# Patient Record
Sex: Female | Born: 1955 | Race: White | Hispanic: No | Marital: Married
Health system: Midwestern US, Community
[De-identification: ages and names within clinical notes are randomized; demographics above are authoritative.]

## PROBLEM LIST (undated history)

## (undated) DIAGNOSIS — T8484XA Pain due to internal orthopedic prosthetic devices, implants and grafts, initial encounter: Secondary | ICD-10-CM

## (undated) DIAGNOSIS — S42452A Displaced fracture of lateral condyle of left humerus, initial encounter for closed fracture: Secondary | ICD-10-CM

## (undated) DIAGNOSIS — J939 Pneumothorax, unspecified: Secondary | ICD-10-CM

## (undated) DIAGNOSIS — J95811 Postprocedural pneumothorax: Secondary | ICD-10-CM

## (undated) DIAGNOSIS — K589 Irritable bowel syndrome without diarrhea: Secondary | ICD-10-CM

## (undated) DIAGNOSIS — R5383 Other fatigue: Secondary | ICD-10-CM

## (undated) HISTORY — PX: COLONOSCOPY: SHX174

## (undated) HISTORY — DX: Irritable bowel syndrome, unspecified: K58.9

## (undated) HISTORY — PX: FOOT SURGERY: SHX648

## (undated) HISTORY — DX: Other fatigue: R53.83

## (undated) HISTORY — PX: WISDOM TOOTH EXTRACTION: SHX21

## (undated) HISTORY — PX: TUBAL LIGATION: SHX77

---

## 2002-03-21 ENCOUNTER — Other Ambulatory Visit: Admission: RE | Admit: 2002-03-21 | Discharge: 2002-03-21 | Payer: Self-pay | Admitting: *Deleted

## 2002-10-17 ENCOUNTER — Encounter: Payer: Self-pay | Admitting: Internal Medicine

## 2002-10-17 ENCOUNTER — Encounter: Admission: RE | Admit: 2002-10-17 | Discharge: 2002-10-17 | Payer: Self-pay | Admitting: Internal Medicine

## 2003-08-21 ENCOUNTER — Other Ambulatory Visit: Admission: RE | Admit: 2003-08-21 | Discharge: 2003-08-21 | Payer: Self-pay | Admitting: Obstetrics and Gynecology

## 2005-07-19 ENCOUNTER — Ambulatory Visit: Payer: Self-pay | Admitting: Gastroenterology

## 2005-07-28 ENCOUNTER — Ambulatory Visit: Payer: Self-pay | Admitting: Gastroenterology

## 2005-08-11 ENCOUNTER — Ambulatory Visit (HOSPITAL_COMMUNITY): Admission: RE | Admit: 2005-08-11 | Discharge: 2005-08-11 | Payer: Self-pay | Admitting: *Deleted

## 2008-02-18 IMAGING — CT CT ABD-PELV W/O CM
4 of 7 series · 14 of 42 positions shown, 20 images · IV contrast (CONTRAST)
Comparison: none

[Series 2: wo · axial · 0.71mm/px · z∈[+1290,+1415]mm · 3 of 51 slices shown]
[im 13/51  soft-tissue]
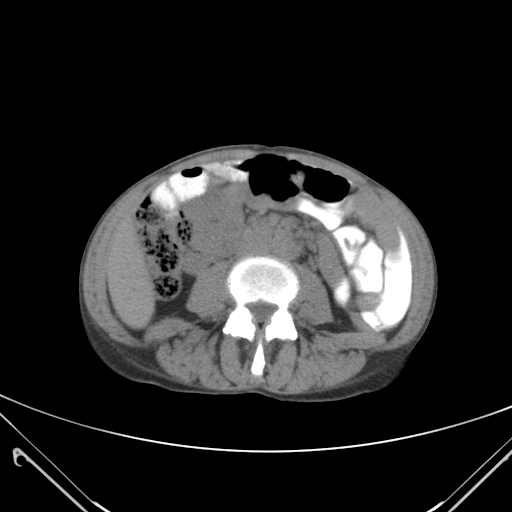
[im 26/51  soft-tissue]
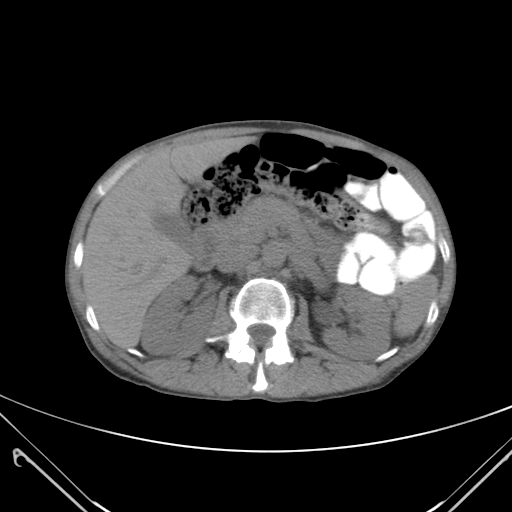
[im 38/51  soft-tissue]
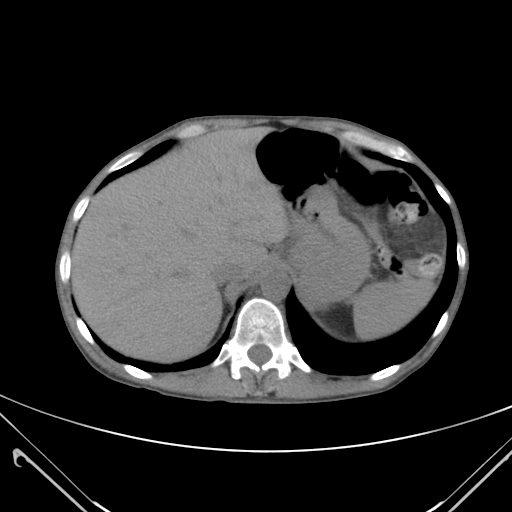

[Series 3: arterial · axial · arterial · 0.71mm/px · z∈[+1290,+1355]mm · 2 of 51 slices shown]
[im 13/51  soft-tissue]
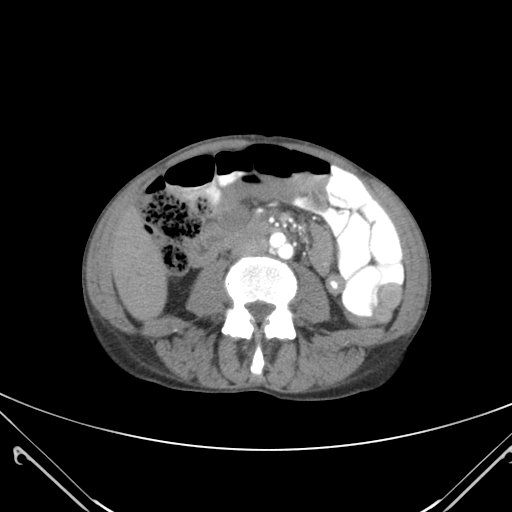
[im 26/51  soft-tissue]
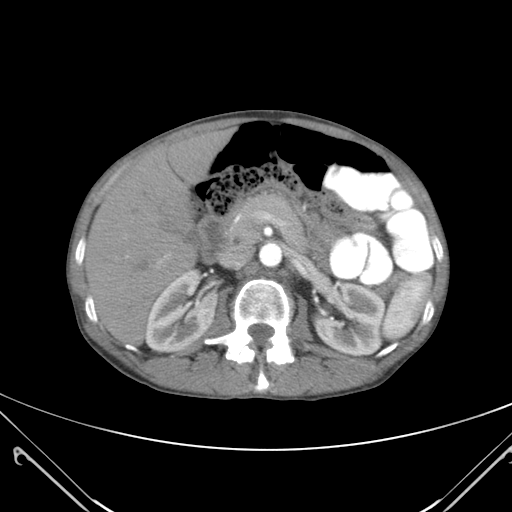

[Series 4: venous · axial · portal-venous · 0.71mm/px · z∈[+1090,+1415]mm · 6 of 91 slices shown, 11 images]
[im 13/91  soft-tissue]
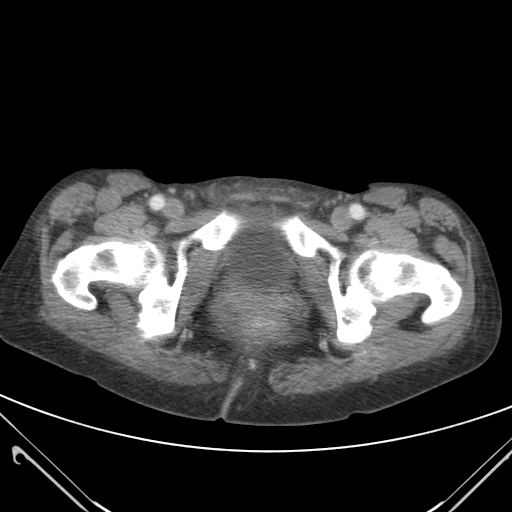
[im 13/91  bone]
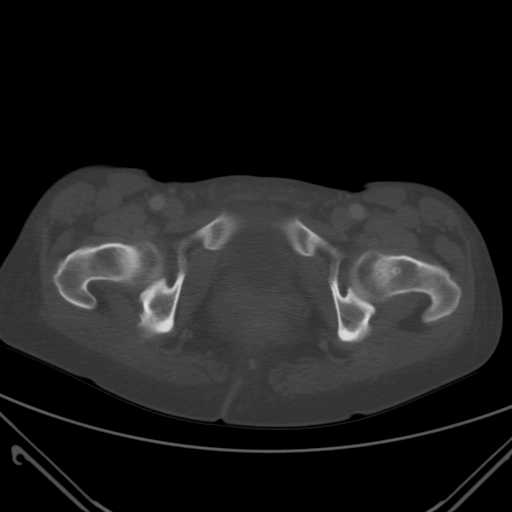
[im 26/91  soft-tissue]
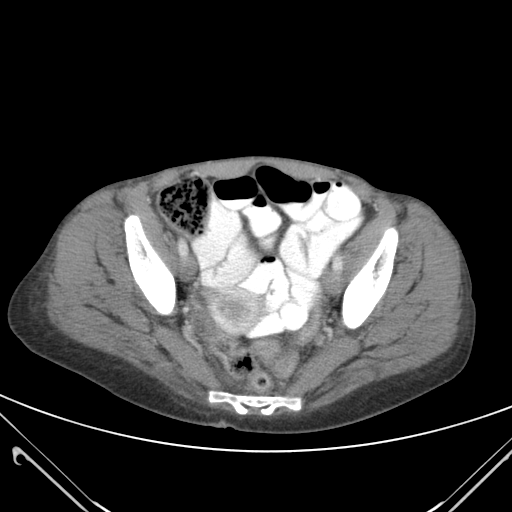
[im 39/91  soft-tissue]
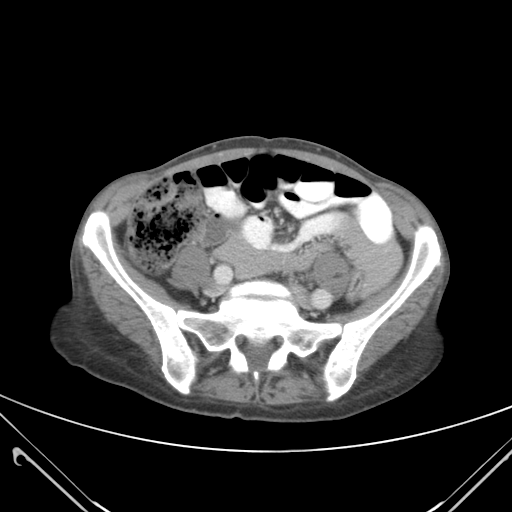
[im 39/91  lung]
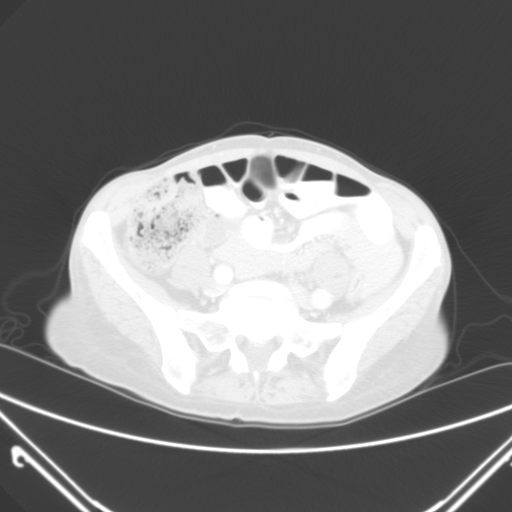
[im 52/91  soft-tissue]
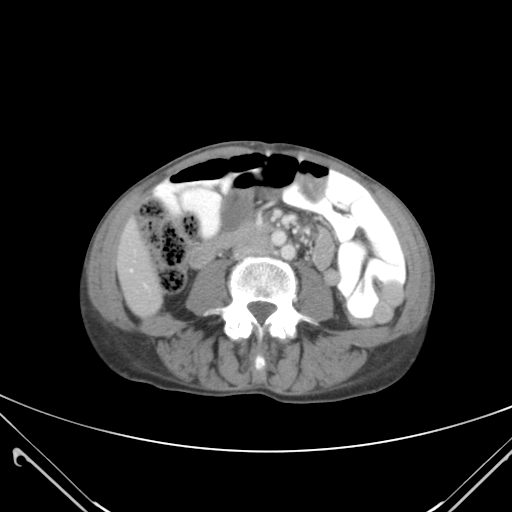
[im 52/91  lung]
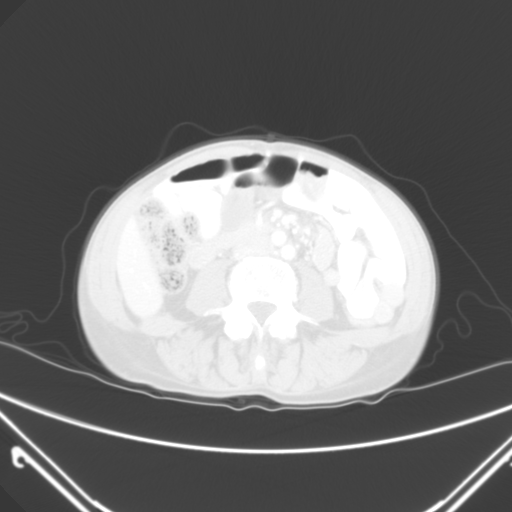
[im 65/91  soft-tissue]
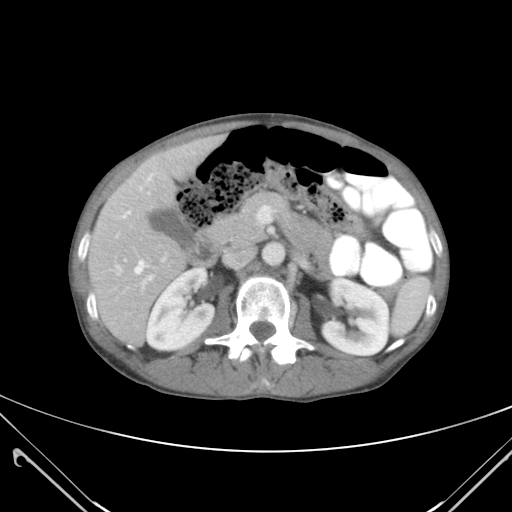
[im 65/91  lung]
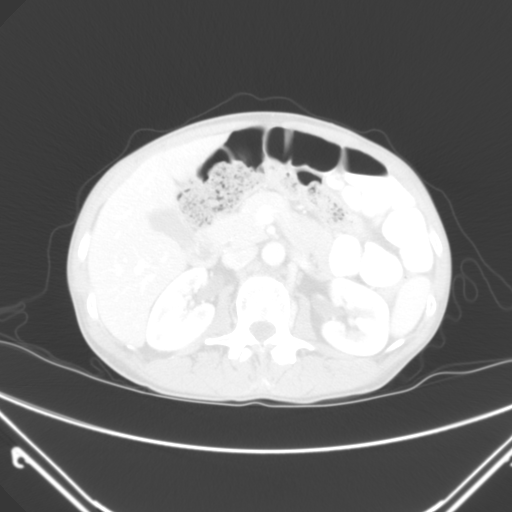
[im 78/91  soft-tissue]
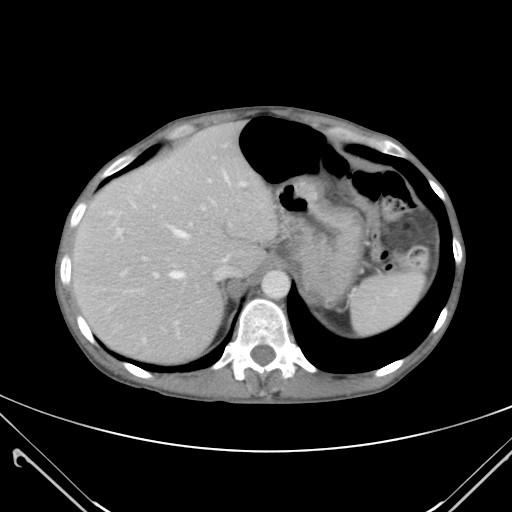
[im 78/91  lung]
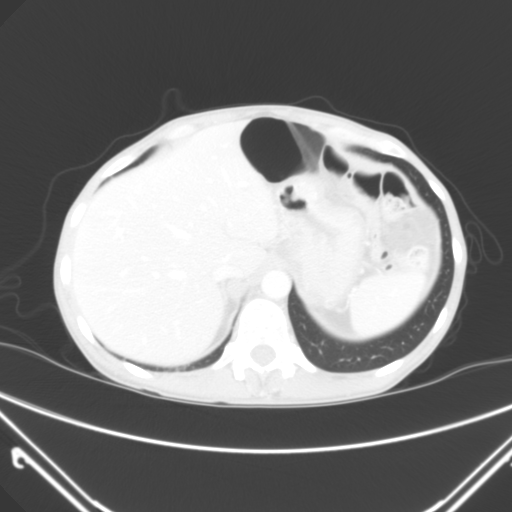

[coronal · coronal · 0.88mm/px · 3 of 72 slices shown, 4 images]
[im 24/72  soft-tissue]
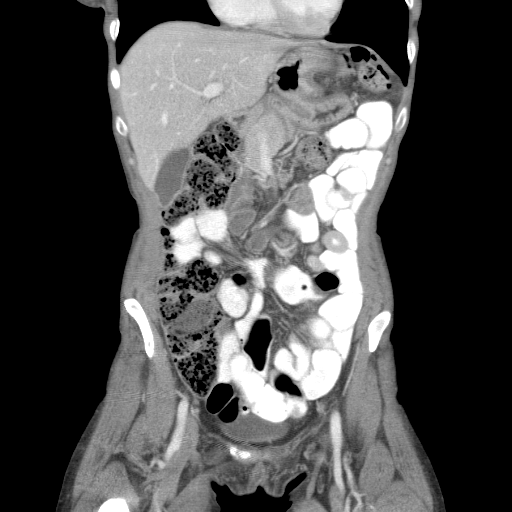
[im 32/72  soft-tissue]
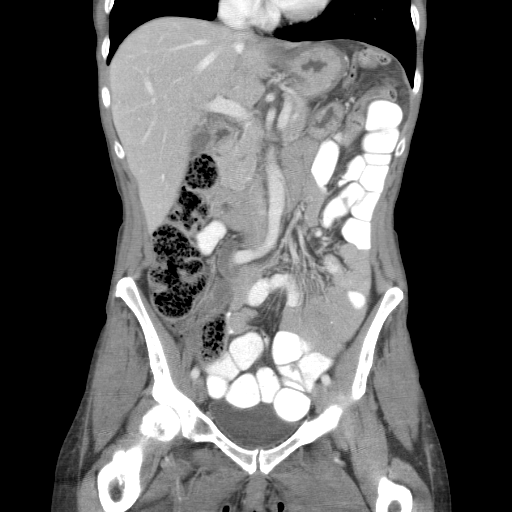
[im 32/72  bone]
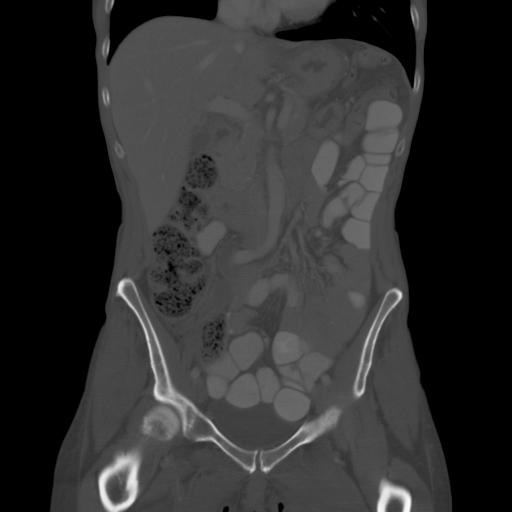
[im 40/72  soft-tissue]
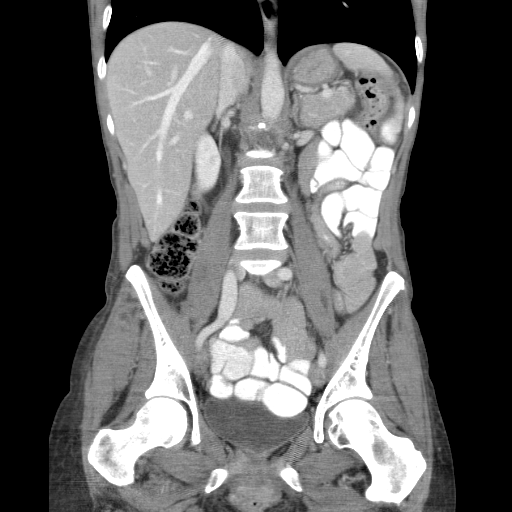

[14 of 42 positions shown; findings below may reference images not displayed]

Canned report from images found in remote index.

Refer to host system for actual result text.

## 2008-03-17 IMAGING — US US TRANSVAGINAL NON-OB
1 series · 14 of 25 positions shown · non-contrast
Comparison: none

CLINICAL DATA: Possible ovarian cyst.  Left lower quadrant pain.
 TRANSABDOMINAL AND TRANSVAGINAL PELVIC ULTRASOUND:
TECHNIQUE: Both transabdominal and transvaginal ultrasound examinations of the pelvis were performed including evaluation of the uterus, ovaries, adnexal regions, and pelvic cul-de-sac.

[Series 1: us transvaginal non-ob · 0.27mm/px · 14 of 74 slices shown]
[im 1/74]
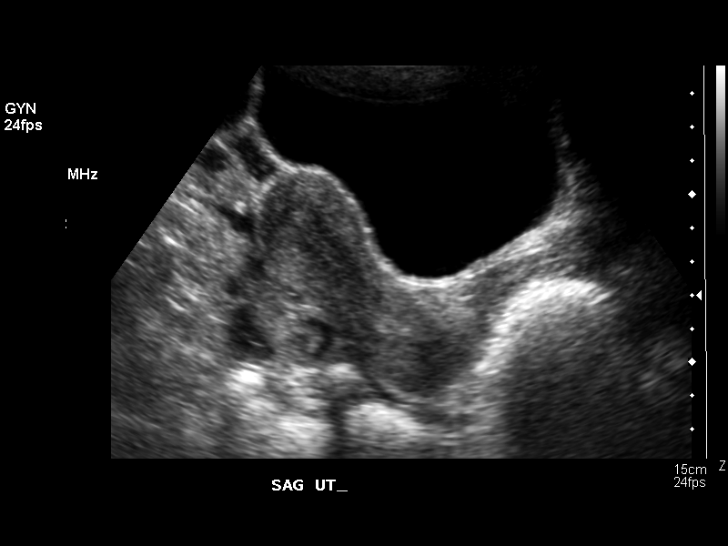
[im 7/74]
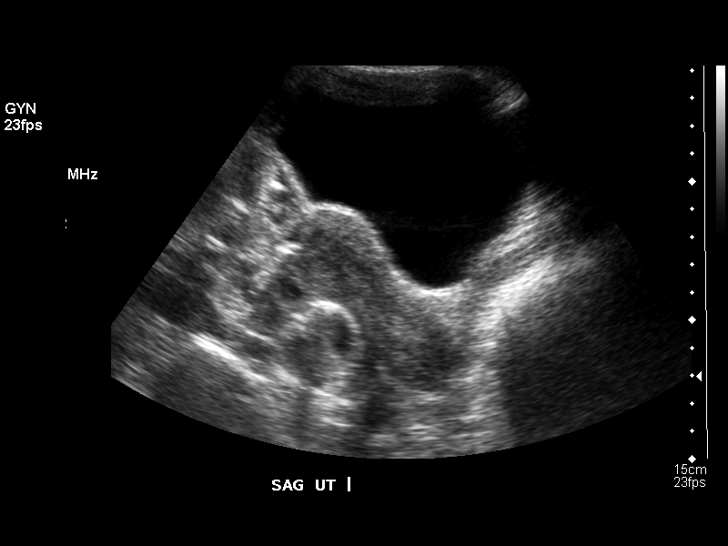
[im 13/74]
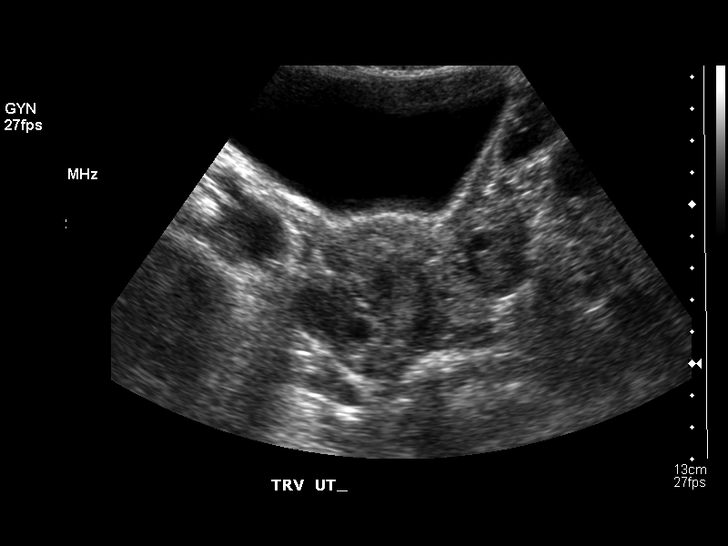
[im 19/74]
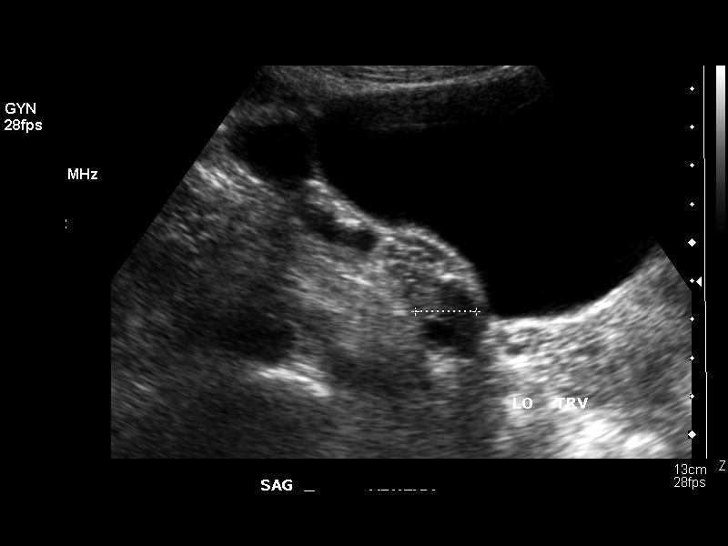
[im 25/74]
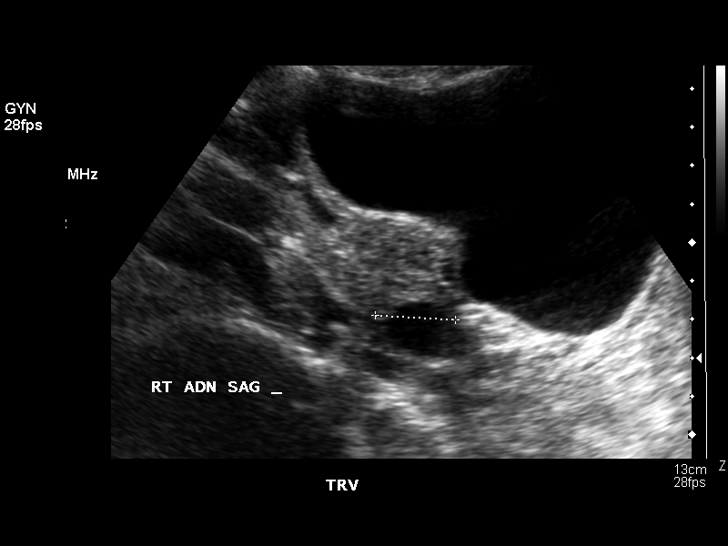
[im 28/74]
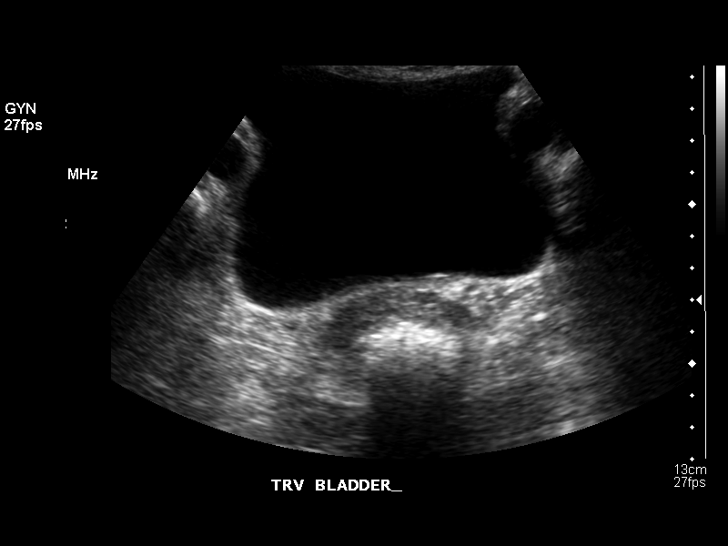
[im 34/74]
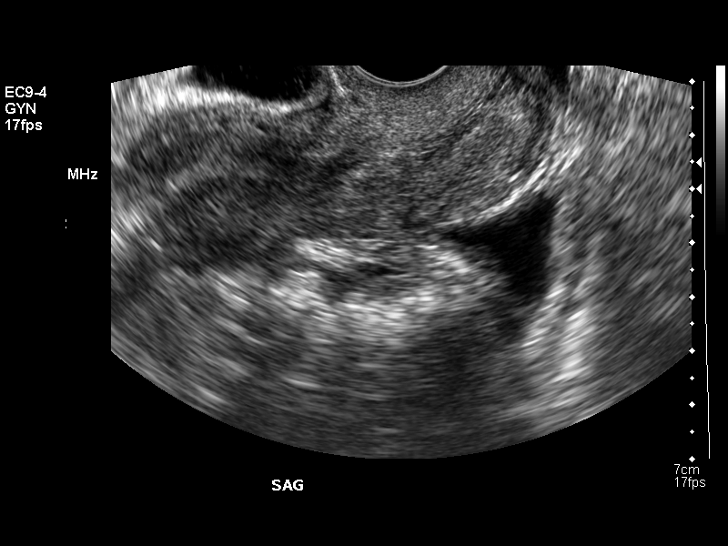
[im 40/74]
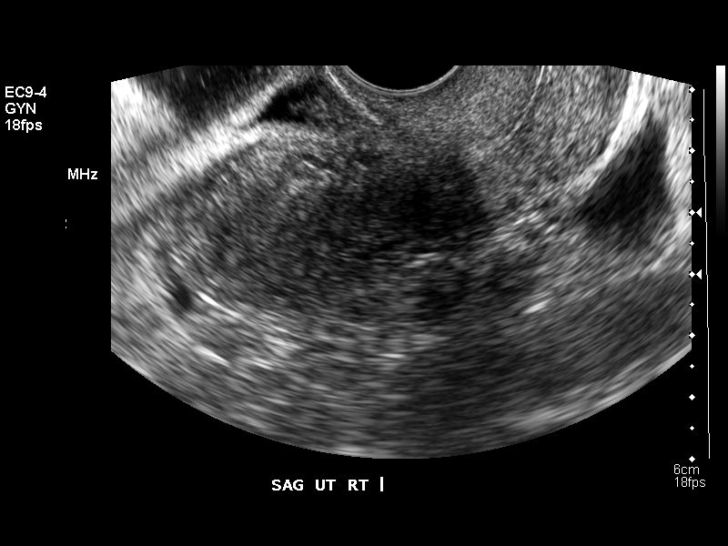
[im 46/74]
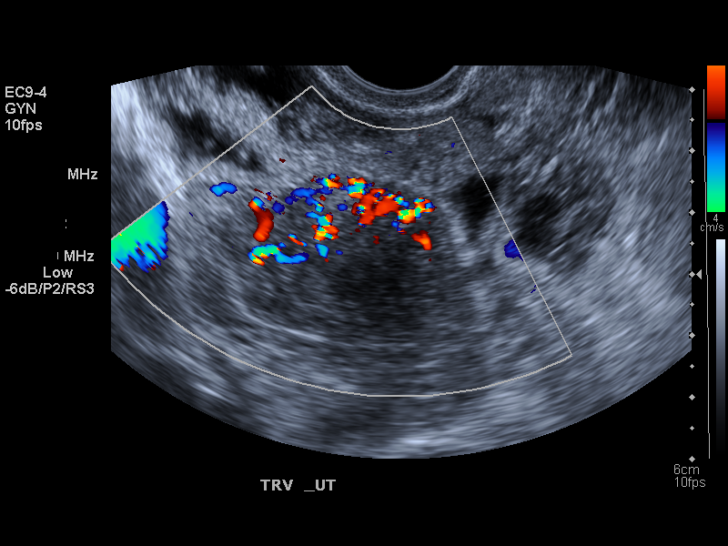
[im 49/74]
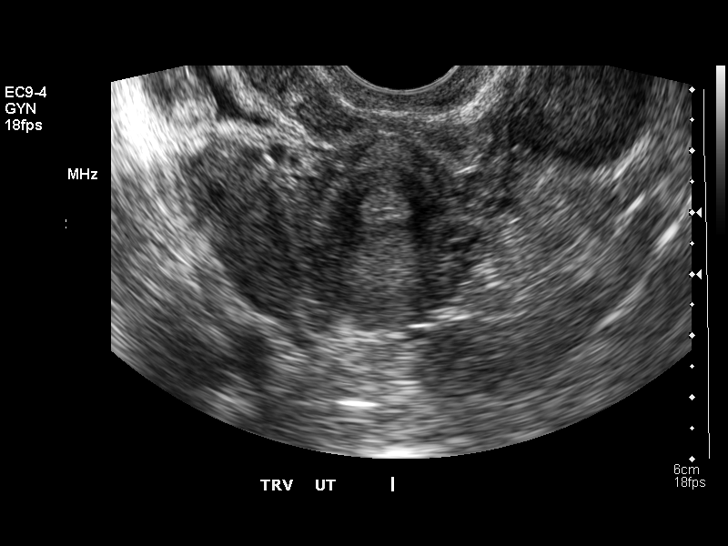
[im 55/74]
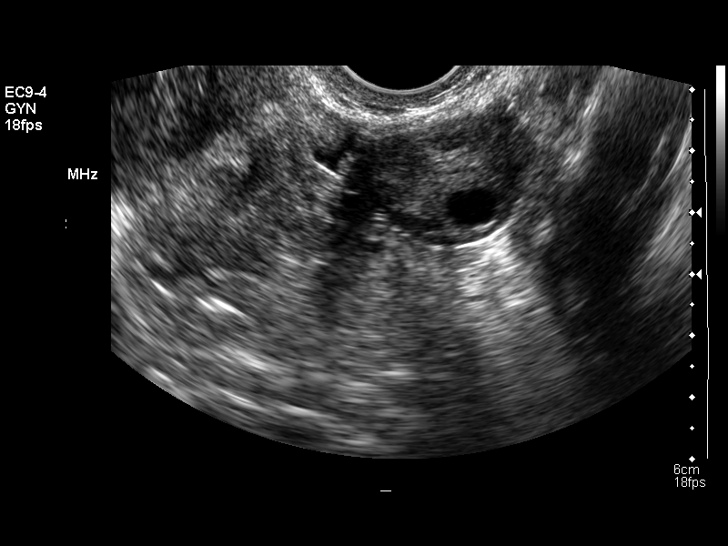
[im 61/74]
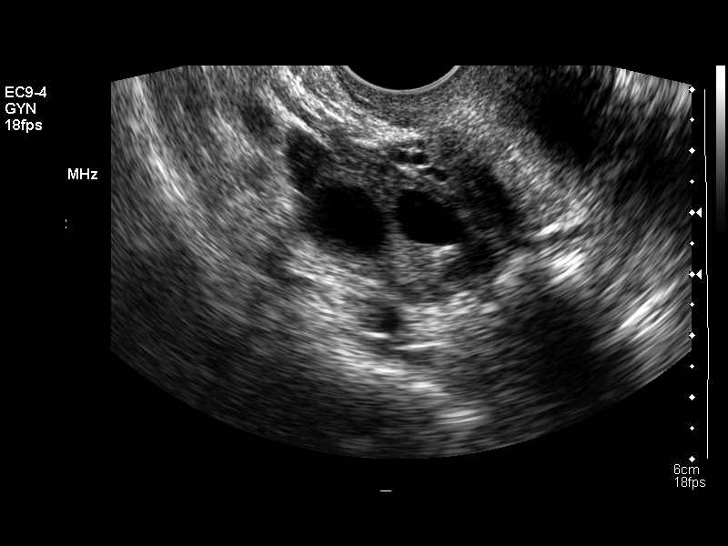
[im 67/74]
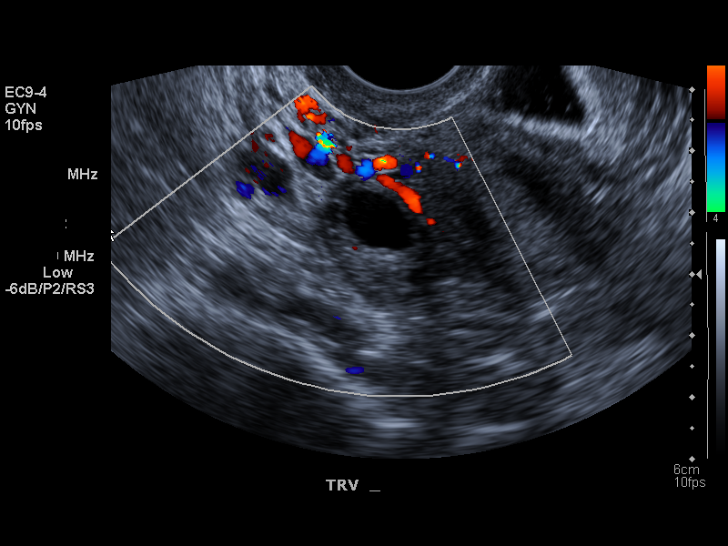
[im 74/74]
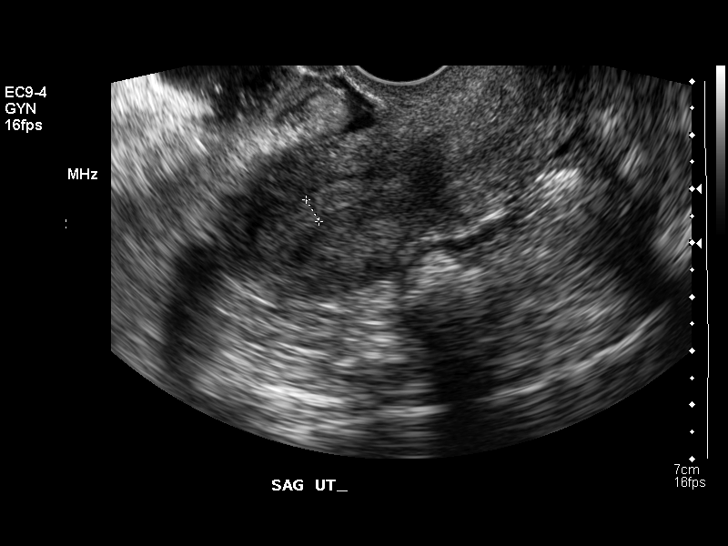

[14 of 25 positions shown; findings below may reference images not displayed]

FINDINGS: The uterus measures 8.1 x 3.1 x 3.2 cm.  The endometrial stripe is 5 - 6 mm in thickness.  Myometrial echotexture is homogeneous.  
 The right ovary measures 3.3 x 1.8 x 1.9 cm.  The left ovary measures 2.5 x 2.0 x 1.9 cm.   A small amount of simple appearing free fluid is noted in the cul-de-sac.
IMPRESSION: 1.  Normal sonographic appearance of the uterus and ovaries.
 2.  Small amount of free fluid in the cul-de-sac.

## 2015-06-30 ENCOUNTER — Encounter: Payer: Self-pay | Admitting: Internal Medicine

## 2015-07-26 ENCOUNTER — Encounter: Payer: Self-pay | Admitting: Gastroenterology

## 2015-10-05 ENCOUNTER — Encounter: Payer: Self-pay | Admitting: Gastroenterology

## 2015-10-27 ENCOUNTER — Ambulatory Visit (AMBULATORY_SURGERY_CENTER): Payer: BLUE CROSS/BLUE SHIELD

## 2015-10-27 VITALS — Ht 68.0 in | Wt 125.8 lb

## 2015-10-27 DIAGNOSIS — Z1211 Encounter for screening for malignant neoplasm of colon: Secondary | ICD-10-CM

## 2015-10-27 MED ORDER — SUPREP BOWEL PREP KIT 17.5-3.13-1.6 GM/177ML PO SOLN: 1.0000 | Freq: Once | ORAL | Status: DC

## 2015-10-27 NOTE — Progress Notes (Signed)
No allergies to eggs or soy No past problems with anesthesia No diet meds No home oxygen  Has email and internet registered for emmi 

## 2015-11-03 DIAGNOSIS — L821 Other seborrheic keratosis: Secondary | ICD-10-CM | POA: Diagnosis not present

## 2015-11-03 DIAGNOSIS — D225 Melanocytic nevi of trunk: Secondary | ICD-10-CM | POA: Diagnosis not present

## 2015-11-03 DIAGNOSIS — D2271 Melanocytic nevi of right lower limb, including hip: Secondary | ICD-10-CM | POA: Diagnosis not present

## 2015-11-03 DIAGNOSIS — D1801 Hemangioma of skin and subcutaneous tissue: Secondary | ICD-10-CM | POA: Diagnosis not present

## 2015-11-03 DIAGNOSIS — L308 Other specified dermatitis: Secondary | ICD-10-CM | POA: Diagnosis not present

## 2015-11-18 ENCOUNTER — Ambulatory Visit (AMBULATORY_SURGERY_CENTER): Payer: BLUE CROSS/BLUE SHIELD | Admitting: Gastroenterology

## 2015-11-18 ENCOUNTER — Encounter: Payer: Self-pay | Admitting: Gastroenterology

## 2015-11-18 VITALS — BP 100/64 | HR 55 | Temp 98.9°F | Resp 15 | Ht 68.0 in | Wt 125.0 lb

## 2015-11-18 DIAGNOSIS — Z1211 Encounter for screening for malignant neoplasm of colon: Secondary | ICD-10-CM | POA: Diagnosis not present

## 2015-11-18 MED ORDER — SODIUM CHLORIDE 0.9 % IV SOLN: 500.0000 mL | INTRAVENOUS | Status: DC

## 2015-11-18 NOTE — Progress Notes (Signed)
Report to PACU, RN, vss, BBS= Clear.  

## 2015-11-18 NOTE — Patient Instructions (Signed)
YOU HAD AN ENDOSCOPIC PROCEDURE TODAY AT THE [facility]:   Refer to the procedure report that was given to you for any specific questions about what was found during the examination.  If the procedure report does not answer your questions, please call your gastroenterologist to clarify.  If you requested that your care partner not be given the details of your procedure findings, then the procedure report has been included in a sealed envelope for you to review at your convenience later.  YOU SHOULD EXPECT: Some feelings of bloating in the abdomen. Passage of more gas than usual.  Walking can help get rid of the air that was put into your GI tract during the procedure and reduce the bloating. If you had a lower endoscopy (such as a colonoscopy or flexible sigmoidoscopy) you may notice spotting of blood in your stool or on the toilet paper. If you underwent a bowel prep for your procedure, you may not have a normal bowel movement for a few days.  Please Note:  You might notice some irritation and congestion in your nose or some drainage.  This is from the oxygen used during your procedure.  There is no need for concern and it should clear up in a day or so.  SYMPTOMS TO REPORT IMMEDIATELY:   Following lower endoscopy (colonoscopy or flexible sigmoidoscopy):  Excessive amounts of blood in the stool  Significant tenderness or worsening of abdominal pains  Swelling of the abdomen that is new, acute  Fever of 100F or higher  For urgent or emergent issues, a gastroenterologist can be reached at any hour by calling [phone].  DIET: Your first meal following the procedure should be a small meal and then it is ok to progress to your normal diet. Heavy or fried foods are harder to digest and may make you feel nauseous or bloated.  Likewise, meals heavy in dairy and vegetables can increase bloating.  Drink plenty of fluids but you should avoid alcoholic beverages for 24 hours.  ACTIVITY:   You should plan to take it easy for the rest of today and you should NOT DRIVE or use heavy machinery until tomorrow (because of the sedation medicines used during the test).    FOLLOW UP: Our staff will call the number listed on your records the next business day following your procedure to check on you and address any questions or concerns that you may have regarding the information given to you following your procedure. If we do not reach you, we will leave a message.  However, if you are feeling well and you are not experiencing any problems, there is no need to return our call.  We will assume that you have returned to your regular daily activities without incident.  SIGNATURES/CONFIDENTIALITY: You and/or your care partner have signed paperwork which will be entered into your electronic medical record.  These signatures attest to the fact that that the information above on your After Visit Summary has been reviewed and is understood.  Full responsibility of the confidentiality of this discharge information lies with you and/or your care-partner.  Please continue your normal medications  Please read over handouts about hemorrhoids and high fiber diet  Next colonoscopy- 10 years

## 2015-11-18 NOTE — Op Note (Signed)
[facility] Patient Name: Melissa FriezeBeth Duarte Procedure Date: 11/18/2015 7:46 AM MRN: [id number] Endoscopist: Viviann SpareSteven P. Adela LankArmbruster , MD Age: 60 Referring MD:  Date of Birth: May 09, 1956 Gender: Female Account #: [id number] Procedure:                Colonoscopy Indications:              Screening for malignant neoplasm in the colon Medicines:                Monitored Anesthesia Care Procedure:                Pre-Anesthesia Assessment:                           - Prior to the procedure, a History and Physical                            was performed, and patient medications and                            allergies were reviewed. The patient's tolerance of                            previous anesthesia was also reviewed. The risks                            and benefits of the procedure and the sedation                            options and risks were discussed with the patient.                            All questions were answered, and informed consent                            was obtained. Prior Anticoagulants: The patient has                            taken no previous anticoagulant or antiplatelet                            agents. ASA Grade Assessment: II - A patient with                            mild systemic disease. After reviewing the risks                            and benefits, the patient was deemed in                            satisfactory condition to undergo the procedure.                           After obtaining informed consent, the colonoscope  was passed under direct vision. Throughout the                            procedure, the patient's blood pressure, pulse, and                            oxygen saturations were monitored continuously. The                            Model PCF-H190L [id number]) scope was introduced                            through the anus and advanced to the the cecum,                            identified  by appendiceal orifice and ileocecal                            valve. The colonoscopy was technically difficult                            and complex due to significant looping. The patient                            tolerated the procedure well. The quality of the                            bowel preparation was good. The ileocecal valve,                            appendiceal orifice, and rectum were photographed. Scope In: 7:59:57 AM Scope Out: 8:18:51 AM Scope Withdrawal Time: 0 hours 11 minutes 53 seconds  Total Procedure Duration: 0 hours 18 minutes 54 seconds  Findings:                 The perianal and digital rectal examinations were                            normal.                           The colon (entire examined portion) was tortuous.                           Non-bleeding internal hemorrhoids were found during                            retroflexion.                           The exam was otherwise without abnormality. No                            polyps or mass lesions noted. Complications:            No immediate complications.  Estimated blood loss:                            None. Estimated Blood Loss:     Estimated blood loss: none. Impression:               - Tortuous colon.                           - Non-bleeding internal hemorrhoids.                           - The examination was otherwise normal.                           - No specimens collected. Recommendation:           - Patient has a contact number available for                            emergencies. The signs and symptoms of potential                            delayed complications were discussed with the                            patient. Return to normal activities tomorrow.                            Written discharge instructions were provided to the                            patient.                           - Resume previous diet.                           - Continue present medications.                            - Repeat colonoscopy in 10 years for screening                            purposes. Viviann SpareSteven P. Gwendlyon Zumbro, MD 11/18/2015 8:22:02 AM This report has been signed electronically.

## 2015-11-19 ENCOUNTER — Telehealth: Payer: Self-pay

## 2015-11-19 NOTE — Telephone Encounter (Signed)
  Follow up Call-  Call back number 11/18/2015  Post procedure Call Back phone  # [phone]  Permission to leave phone message Yes     Patient was called for follow up after her procedure on 7/6.2017. No answer at the number given for follow up phone call. A message was left on the answering machine.

## 2016-02-03 DIAGNOSIS — Z01419 Encounter for gynecological examination (general) (routine) without abnormal findings: Secondary | ICD-10-CM | POA: Diagnosis not present

## 2016-02-03 DIAGNOSIS — Z681 Body mass index (BMI) 19 or less, adult: Secondary | ICD-10-CM | POA: Diagnosis not present

## 2016-03-15 DIAGNOSIS — Z0000 Encounter for general adult medical examination without abnormal findings: Secondary | ICD-10-CM | POA: Diagnosis not present

## 2016-03-15 DIAGNOSIS — R8299 Other abnormal findings in urine: Secondary | ICD-10-CM | POA: Diagnosis not present

## 2016-03-23 DIAGNOSIS — Z0000 Encounter for general adult medical examination without abnormal findings: Secondary | ICD-10-CM | POA: Diagnosis not present

## 2016-03-23 DIAGNOSIS — Z1389 Encounter for screening for other disorder: Secondary | ICD-10-CM | POA: Diagnosis not present

## 2016-07-26 DIAGNOSIS — Z1231 Encounter for screening mammogram for malignant neoplasm of breast: Secondary | ICD-10-CM | POA: Diagnosis not present

## 2016-11-14 DIAGNOSIS — D225 Melanocytic nevi of trunk: Secondary | ICD-10-CM | POA: Diagnosis not present

## 2016-11-14 DIAGNOSIS — L570 Actinic keratosis: Secondary | ICD-10-CM | POA: Diagnosis not present

## 2016-11-14 DIAGNOSIS — D2262 Melanocytic nevi of left upper limb, including shoulder: Secondary | ICD-10-CM | POA: Diagnosis not present

## 2016-11-14 DIAGNOSIS — D2261 Melanocytic nevi of right upper limb, including shoulder: Secondary | ICD-10-CM | POA: Diagnosis not present

## 2016-11-14 DIAGNOSIS — D1801 Hemangioma of skin and subcutaneous tissue: Secondary | ICD-10-CM | POA: Diagnosis not present

## 2017-02-07 DIAGNOSIS — Z01419 Encounter for gynecological examination (general) (routine) without abnormal findings: Secondary | ICD-10-CM | POA: Diagnosis not present

## 2017-02-07 DIAGNOSIS — Z681 Body mass index (BMI) 19 or less, adult: Secondary | ICD-10-CM | POA: Diagnosis not present

## 2017-03-20 DIAGNOSIS — R946 Abnormal results of thyroid function studies: Secondary | ICD-10-CM | POA: Diagnosis not present

## 2017-03-20 DIAGNOSIS — R350 Frequency of micturition: Secondary | ICD-10-CM | POA: Diagnosis not present

## 2017-03-20 DIAGNOSIS — Z0000 Encounter for general adult medical examination without abnormal findings: Secondary | ICD-10-CM | POA: Diagnosis not present

## 2017-03-29 DIAGNOSIS — Z0000 Encounter for general adult medical examination without abnormal findings: Secondary | ICD-10-CM | POA: Diagnosis not present

## 2017-03-29 DIAGNOSIS — D72819 Decreased white blood cell count, unspecified: Secondary | ICD-10-CM | POA: Diagnosis not present

## 2017-03-29 DIAGNOSIS — F418 Other specified anxiety disorders: Secondary | ICD-10-CM | POA: Diagnosis not present

## 2017-03-29 DIAGNOSIS — Z1389 Encounter for screening for other disorder: Secondary | ICD-10-CM | POA: Diagnosis not present

## 2017-03-29 DIAGNOSIS — G4709 Other insomnia: Secondary | ICD-10-CM | POA: Diagnosis not present

## 2017-03-29 DIAGNOSIS — D7589 Other specified diseases of blood and blood-forming organs: Secondary | ICD-10-CM | POA: Diagnosis not present

## 2017-03-29 DIAGNOSIS — Z23 Encounter for immunization: Secondary | ICD-10-CM | POA: Diagnosis not present

## 2017-04-06 DIAGNOSIS — Z1212 Encounter for screening for malignant neoplasm of rectum: Secondary | ICD-10-CM | POA: Diagnosis not present

## 2017-08-06 DIAGNOSIS — Z1231 Encounter for screening mammogram for malignant neoplasm of breast: Secondary | ICD-10-CM | POA: Diagnosis not present

## 2017-09-19 DIAGNOSIS — D2262 Melanocytic nevi of left upper limb, including shoulder: Secondary | ICD-10-CM | POA: Diagnosis not present

## 2017-09-19 DIAGNOSIS — D225 Melanocytic nevi of trunk: Secondary | ICD-10-CM | POA: Diagnosis not present

## 2017-09-19 DIAGNOSIS — D2271 Melanocytic nevi of right lower limb, including hip: Secondary | ICD-10-CM | POA: Diagnosis not present

## 2017-09-19 DIAGNOSIS — L918 Other hypertrophic disorders of the skin: Secondary | ICD-10-CM | POA: Diagnosis not present

## 2018-02-11 DIAGNOSIS — Z01419 Encounter for gynecological examination (general) (routine) without abnormal findings: Secondary | ICD-10-CM | POA: Diagnosis not present

## 2018-02-11 DIAGNOSIS — Z681 Body mass index (BMI) 19 or less, adult: Secondary | ICD-10-CM | POA: Diagnosis not present

## 2018-02-11 DIAGNOSIS — Z1151 Encounter for screening for human papillomavirus (HPV): Secondary | ICD-10-CM | POA: Diagnosis not present

## 2018-04-08 DIAGNOSIS — Z0000 Encounter for general adult medical examination without abnormal findings: Secondary | ICD-10-CM | POA: Diagnosis not present

## 2018-04-08 DIAGNOSIS — R946 Abnormal results of thyroid function studies: Secondary | ICD-10-CM | POA: Diagnosis not present

## 2018-04-08 DIAGNOSIS — R82998 Other abnormal findings in urine: Secondary | ICD-10-CM | POA: Diagnosis not present

## 2018-04-10 DIAGNOSIS — Z1212 Encounter for screening for malignant neoplasm of rectum: Secondary | ICD-10-CM | POA: Diagnosis not present

## 2018-04-15 DIAGNOSIS — F418 Other specified anxiety disorders: Secondary | ICD-10-CM | POA: Diagnosis not present

## 2018-04-15 DIAGNOSIS — D7589 Other specified diseases of blood and blood-forming organs: Secondary | ICD-10-CM | POA: Diagnosis not present

## 2018-04-15 DIAGNOSIS — R636 Underweight: Secondary | ICD-10-CM | POA: Diagnosis not present

## 2018-04-15 DIAGNOSIS — Z0000 Encounter for general adult medical examination without abnormal findings: Secondary | ICD-10-CM | POA: Diagnosis not present

## 2018-04-15 DIAGNOSIS — D72819 Decreased white blood cell count, unspecified: Secondary | ICD-10-CM | POA: Diagnosis not present

## 2020-05-18 ENCOUNTER — Inpatient Hospital Stay: Admit: 2020-05-18

## 2021-06-21 ENCOUNTER — Emergency Department: Admit: 2021-06-22 | Payer: MEDICARE | Primary: Diagnostic Radiology

## 2021-06-21 DIAGNOSIS — S42452A Displaced fracture of lateral condyle of left humerus, initial encounter for closed fracture: Secondary | ICD-10-CM

## 2021-06-21 DIAGNOSIS — S0990XA Unspecified injury of head, initial encounter: Secondary | ICD-10-CM

## 2021-06-21 NOTE — ED Triage Notes (Signed)
Pt to ED with c/o left elbow pain and lip laceration after tripping and falling. Pt states they just had dinner downtown and were walking fast when she tripped and hit the pavement. Pt denies loss of consciousness. Bleeding controlled to upper lip. Pt alert ambulatory and in NAD at this time.

## 2021-06-21 NOTE — ED Provider Notes (Signed)
Emergency Department Provider Note                   PCP:                PROVIDER UNKNOWN, MD               Age: [age] y.o.      Sex: female       ICD-10-CM    1. Injury of head, initial encounter  S09.90XA       2. Facial laceration, initial encounter  S01.81XA HYDROcodone-acetaminophen (LORCET) 5-325 MG per tablet      3. Closed fracture of capitulum of left humerus, initial encounter  S42.452A HYDROcodone-acetaminophen (LORCET) 5-325 MG per tablet          DISPOSITION Decision To Discharge 06/22/2021 12:30:41 AM        Debbie Gonzalez is a 66 y.o. female who presents to the Emergency Department with chief complaint of    Chief Complaint   Patient presents with    Fall      66 year old female presents the department with husband for chief complaint of fall.  She reports [place] dinner, [place] [address] when she tripped and fell forward.  She sustained a lip laceration.  She is also complaining of some left elbow pain.  She denies use of blood thinners.  She denies losing consciousness.  She reports worsening facial pain and left elbow pain.    The history is provided by the patient. No language interpreter was used.       Review of Systems   Constitutional:  Negative for chills and fever.   HENT:  Negative for congestion and sore throat.    Respiratory:  Negative for cough and shortness of breath.    Cardiovascular:  Negative for chest pain and palpitations.   Gastrointestinal:  Negative for abdominal pain, nausea and vomiting.   Genitourinary:  Negative for dysuria and vaginal discharge.   Musculoskeletal:  Positive for arthralgias. Negative for back pain.   Skin:  Positive for wound (lip laceration).   Neurological:  Positive for headaches.   Psychiatric/Behavioral:  Negative for agitation.    All other systems reviewed and are negative.    History reviewed. No pertinent past medical history.     History reviewed. No pertinent surgical history.     History reviewed. No pertinent family history.      Social History     Socioeconomic History    Marital status: Married     Spouse name: None    Number of children: None    Years of education: None    Highest education level: None   Tobacco Use    Smoking status: Never    Smokeless tobacco: Never   Vaping Use    Vaping Use: Never used   Substance and Sexual Activity    Alcohol use: Yes    Drug use: Never    Sexual activity: Not Currently         Patient has no known allergies.     Discharge Medication List as of 06/22/2021 12:12 AM        CONTINUE these medications which have NOT CHANGED    Details   sertraline (ZOLOFT) 25 MG tablet Take 25 mg by mouth dailyHistorical Med              Vitals signs and nursing note reviewed.   Patient Vitals for the past 4 hrs:   Temp Pulse  Resp BP SpO2   06/21/21 2145 -- -- -- 107/67 --   06/21/21 2142 98.2 ??F (36.8 ??C) 77 19 -- 99 %          Physical Exam  Vitals and nursing note reviewed.   Constitutional:       General: She is not in acute distress.     Appearance: Normal appearance. She is not ill-appearing, toxic-appearing or diaphoretic.   HENT:      Head: Normocephalic and atraumatic.        Comments: Upper lip laceration that crosses vermillion border     Nose: Nose normal.      Mouth/Throat:      Mouth: Mucous membranes are moist.   Eyes:      Pupils: Pupils are equal, round, and reactive to light.   Cardiovascular:      Rate and Rhythm: Normal rate.   Pulmonary:      Effort: Pulmonary effort is normal. No respiratory distress.   Abdominal:      General: Abdomen is flat.      Palpations: Abdomen is soft.      Tenderness: There is no abdominal tenderness.   Musculoskeletal:         General: Normal range of motion.      Left elbow: Swelling present. Tenderness present.      Cervical back: Normal range of motion. No rigidity.   Skin:     General: Skin is warm.   Neurological:      General: No focal deficit present.      Mental Status: She is alert.   Psychiatric:         Mood and Affect: Mood normal.        Lac  Repair    Date/Time: 06/22/2021 12:34 AM  Performed by: Alesia Richards, PA  Authorized by: Parks Neptune Sheral Flow., MD     Consent:     Consent obtained:  Verbal    Risks discussed:  Pain, poor cosmetic result, poor wound healing and infection  Universal protocol:     Patient identity confirmed:  Verbally with patient  Laceration details:     Location:  Lip    Lip location:  Upper lip, full thickness    Vermilion border involved: yes      Height of lip laceration:  Up to half vertical height    Length (cm):  2.5  Pre-procedure details:     Preparation:  Patient was prepped and draped in usual sterile fashion  Exploration:     Limited defect created (wound extended): no      Hemostasis achieved with:  Direct pressure    Contaminated: yes    Treatment:     Amount of cleaning:  Extensive    Irrigation method:  Pressure wash    Debridement:  None    Scar revision: no    Skin repair:     Repair method:  Sutures    Suture size:  4-0    Suture material:  Nylon and fast-absorbing gut    Number of sutures:  4  Approximation:     Approximation:  Close    Vermilion border well-aligned: yes    Repair type:     Repair type:  Complex  Post-procedure details:     Dressing:  Open (no dressing)    Procedure completion:  Tolerated  Comments:      2 absorbable sutures and 2 nonabsorbable sutures.    Medical Decision Making  X-ray shows  a fracture dislocation of the capitellum of the left elbow.  Recommended CT scan.  I discussed case with on-call orthopedist, Dr. Ned Card.  Discussed I have ordered a CT scan and patient will necessitate follow-up.  Orthopedist agrees, he will see patient in office likely tomorrow.  Patient was placed into a sling.  Furthermore, head CT and CT face was without any acute findings.  Her laceration was repaired with sutures, see procedure note.  Patient stable for discharge home.  I will treat her pain and give her pain medication to go home with.  She understands she may return here if worsening or  worrisome symptoms develop.    Problems Addressed:  Closed fracture of capitulum of left humerus, initial encounter: complicated acute illness or injury  Facial laceration, initial encounter: complicated acute illness or injury  Injury of head, initial encounter: complicated acute illness or injury    Amount and/or Complexity of Data Reviewed  Radiology: ordered.     Details: agree with rads  Discussion of management or test interpretation with external provider(s): Discussed xr and CT findings with on call orthopedic to arrange close follow up of patient.    Risk  OTC drugs.  Prescription drug management.       Complexity of Problem:1 acute or chronic illness that poses a threat to life or bodily function. (5)  The patients assessment required an independent historian: I spoke with a family member.  I have conducted an independent ordering and review of X-rays.  I have conducted an independent ordering and review of CT Scan.  I have reviewed records from an external source: ED records from outside this hospital.  Considerations: Shared decision making was utilized in the care of this patient.   We discussed care recommended by provider that patient declined (tests, disposition, etc).  We discussed care requested by patient that the provider declined (includes tests, admit, dc, antibiotics, etc).  I discussed study results with another external provider.  I discussed disposition with another provider.  Patient was discharged risks and benefits of hospitalization were discussed.  I have had a discussion with external consultants.         Orders Placed This Encounter   Procedures    LACERATION REPAIR    CT HEAD WO CONTRAST    CT CERVICAL SPINE WO CONTRAST    CT FACIAL BONES WO CONTRAST    XR ELBOW LEFT (MIN 3 VIEWS)    CT ELBOW LEFT WO CONTRAST    [employer] Shoulder Immobilizer, Left; M        Medications   lidocaine 1 % injection 5 mL (5 mLs IntraDERmal Given by Other 06/22/21 0029)    HYDROcodone-acetaminophen (NORCO) 5-325 MG per tablet 1 tablet (1 tablet Oral Given 06/22/21 0021)       Discharge Medication List as of 06/22/2021 12:12 AM        START taking these medications    Details   HYDROcodone-acetaminophen (LORCET) 5-325 MG per tablet Take 1 tablet by mouth every 4 hours as needed for Pain for up to 3 days. Intended supply: 3 days. Take lowest dose possible to manage pain Max Daily Amount: 6 tablets, Disp-12 tablet, R-0Print      docusate sodium (COLACE) 100 MG capsule Take 1 capsule by mouth 2 times daily For use with opioid pain medications., Disp-28 capsule, R-0Print              Results for orders placed or performed during the hospital encounter  of 06/21/21   CT HEAD WO CONTRAST    Narrative    EXAMINATION: CT HEAD WITHOUT CONTRAST, CT FACIAL BONES WITHOUT CONTRAST, AND   CERVICAL SPINE WITHOUT CONTRAST (3 exams)    DATE: 06/21/2021    INDICATION: Fall. Head, face, and neck injury.    COMPARISON: None.    TECHNIQUE: Noncontrast CT imaging through the brain was performed in the axial   plane. Coronal reformats were generated.  Noncontrast CT imaging through the   facial bones was performed in the axial plane. Coronal and sagittal reformats   were generated. Noncontrast CT imaging through the cervical spine was performed   in the axial plane. Coronal and sagittal reformats were generated.  CT dose   lowering techniques were used, to include: automated exposure control,   adjustment for patient size, and or use of iterative reconstruction.    FINDINGS:     CT HEAD FINDINGS:    Intracranial contents:    The ventricles and sulci are mildly enlarged.  There is no midline shift or mass   effect.  There is no abnormal extra-axial fluid collection or acute   intracranial hemorrhage.    Moderate patchy and confluent low density in the cerebral white matter.    Bones and extracranial soft tissues:    The calvarium is intact.     CT FACIAL BONES FINDINGS:    Bones:    No facial bone fractures are  identified.   The temporomandibular joints are   intact. The paranasal sinuses and mastoid air cells are clear.      Soft tissues:    Soft tissue swelling and subcutaneous stranding at the left cheek. Upper lip   laceration.    Scattered calcifications in the subcutaneous soft tissues over bilateral   zygomatic arches, and also in the masticator spaces. These could be incidental   or from prior cosmetic augmentation. The globes are normal in size and   morphology. The extraocular musculature and optic nerves/sheath complexes are   normal in course and caliber.    CT CERVICAL SPINE FINDINGS:    Vertebral column:    Mild cervical lordotic reversal. Alignment is otherwise normal. Vertebral body   heights are preserved. Moderate disc height loss at C5-6 and mild disc height   loss at C6-7.    No acute cervical spine fracture is identified. Degenerative narrowing of the   atlantodental interval. The craniocervical junction and C1-2 relationship   otherwise appear normal.    Disc levels:    C2-3: There is no significant bony stenosis of the spinal canal or neural   foramina.    C3-4: Mild disc bulge. Left facet hypertrophy. No significant canal or foraminal   narrowing.    C4-5: Left facet and uncinate hypertrophy. Mild left neural foraminal narrowing.    C5-6: Mild disc bulge and endplate osteophytes. Facet and uncinate hypertrophy.   Moderate right and mild to moderate left neural foraminal narrowing.    C6-7: Endplate osteophytes. Facet and uncinate hypertrophy. Moderate to severe   left and moderate right neural foraminal narrowing.    C7-T1: Facet hypertrophy. Mild bilateral neural foraminal narrowing.    Soft tissues:    Prevertebral soft tissues are normal in thickness. No abnormal fluid collection   or hematoma identified in the paraspinal soft tissues.        Impression    CT HEAD IMPRESSION:    1. No acute intracranial abnormality is identified.    2. Mild generalized brain volume  loss and moderate small vessel  ischemic   changes.      CT FACIAL BONES IMPRESSION:    1. No acute facial bone fracture is identified.    2. Upper lip laceration.    3. Left facial soft tissue contusion.      CT CERVICAL SPINE IMPRESSION:    1. No acute cervical spine fracture is identified.    2. Degenerative changes as detailed above.                Serafina Mitchell, M.D.   06/21/2021 10:59:00 PM   CT CERVICAL SPINE WO CONTRAST    Narrative    EXAMINATION: CT HEAD WITHOUT CONTRAST, CT FACIAL BONES WITHOUT CONTRAST, AND   CERVICAL SPINE WITHOUT CONTRAST (3 exams)    DATE: 06/21/2021    INDICATION: Fall. Head, face, and neck injury.    COMPARISON: None.    TECHNIQUE: Noncontrast CT imaging through the brain was performed in the axial   plane. Coronal reformats were generated.  Noncontrast CT imaging through the   facial bones was performed in the axial plane. Coronal and sagittal reformats   were generated. Noncontrast CT imaging through the cervical spine was performed   in the axial plane. Coronal and sagittal reformats were generated.  CT dose   lowering techniques were used, to include: automated exposure control,   adjustment for patient size, and or use of iterative reconstruction.    FINDINGS:     CT HEAD FINDINGS:    Intracranial contents:    The ventricles and sulci are mildly enlarged.  There is no midline shift or mass   effect.  There is no abnormal extra-axial fluid collection or acute   intracranial hemorrhage.    Moderate patchy and confluent low density in the cerebral white matter.    Bones and extracranial soft tissues:    The calvarium is intact.     CT FACIAL BONES FINDINGS:    Bones:    No facial bone fractures are identified.   The temporomandibular joints are   intact. The paranasal sinuses and mastoid air cells are clear.      Soft tissues:    Soft tissue swelling and subcutaneous stranding at the left cheek. Upper lip   laceration.    Scattered calcifications in the subcutaneous soft tissues over bilateral   zygomatic arches,  and also in the masticator spaces. These could be incidental   or from prior cosmetic augmentation. The globes are normal in size and   morphology. The extraocular musculature and optic nerves/sheath complexes are   normal in course and caliber.    CT CERVICAL SPINE FINDINGS:    Vertebral column:    Mild cervical lordotic reversal. Alignment is otherwise normal. Vertebral body   heights are preserved. Moderate disc height loss at C5-6 and mild disc height   loss at C6-7.    No acute cervical spine fracture is identified. Degenerative narrowing of the   atlantodental interval. The craniocervical junction and C1-2 relationship   otherwise appear normal.    Disc levels:    C2-3: There is no significant bony stenosis of the spinal canal or neural   foramina.    C3-4: Mild disc bulge. Left facet hypertrophy. No significant canal or foraminal   narrowing.    C4-5: Left facet and uncinate hypertrophy. Mild left neural foraminal narrowing.    C5-6: Mild disc bulge and endplate osteophytes. Facet and uncinate hypertrophy.   Moderate right and mild to moderate left  neural foraminal narrowing.    C6-7: Endplate osteophytes. Facet and uncinate hypertrophy. Moderate to severe   left and moderate right neural foraminal narrowing.    C7-T1: Facet hypertrophy. Mild bilateral neural foraminal narrowing.    Soft tissues:    Prevertebral soft tissues are normal in thickness. No abnormal fluid collection   or hematoma identified in the paraspinal soft tissues.        Impression    CT HEAD IMPRESSION:    1. No acute intracranial abnormality is identified.    2. Mild generalized brain volume loss and moderate small vessel ischemic   changes.      CT FACIAL BONES IMPRESSION:    1. No acute facial bone fracture is identified.    2. Upper lip laceration.    3. Left facial soft tissue contusion.      CT CERVICAL SPINE IMPRESSION:    1. No acute cervical spine fracture is identified.    2. Degenerative changes as detailed above.                 Serafina Mitchell, M.D.   06/21/2021 10:59:00 PM   CT FACIAL BONES WO CONTRAST    Narrative    EXAMINATION: CT HEAD WITHOUT CONTRAST, CT FACIAL BONES WITHOUT CONTRAST, AND   CERVICAL SPINE WITHOUT CONTRAST (3 exams)    DATE: 06/21/2021    INDICATION: Fall. Head, face, and neck injury.    COMPARISON: None.    TECHNIQUE: Noncontrast CT imaging through the brain was performed in the axial   plane. Coronal reformats were generated.  Noncontrast CT imaging through the   facial bones was performed in the axial plane. Coronal and sagittal reformats   were generated. Noncontrast CT imaging through the cervical spine was performed   in the axial plane. Coronal and sagittal reformats were generated.  CT dose   lowering techniques were used, to include: automated exposure control,   adjustment for patient size, and or use of iterative reconstruction.    FINDINGS:     CT HEAD FINDINGS:    Intracranial contents:    The ventricles and sulci are mildly enlarged.  There is no midline shift or mass   effect.  There is no abnormal extra-axial fluid collection or acute   intracranial hemorrhage.    Moderate patchy and confluent low density in the cerebral white matter.    Bones and extracranial soft tissues:    The calvarium is intact.     CT FACIAL BONES FINDINGS:    Bones:    No facial bone fractures are identified.   The temporomandibular joints are   intact. The paranasal sinuses and mastoid air cells are clear.      Soft tissues:    Soft tissue swelling and subcutaneous stranding at the left cheek. Upper lip   laceration.    Scattered calcifications in the subcutaneous soft tissues over bilateral   zygomatic arches, and also in the masticator spaces. These could be incidental   or from prior cosmetic augmentation. The globes are normal in size and   morphology. The extraocular musculature and optic nerves/sheath complexes are   normal in course and caliber.    CT CERVICAL SPINE FINDINGS:    Vertebral column:    Mild cervical  lordotic reversal. Alignment is otherwise normal. Vertebral body   heights are preserved. Moderate disc height loss at C5-6 and mild disc height   loss at C6-7.    No acute cervical spine fracture is identified. Degenerative  narrowing of the   atlantodental interval. The craniocervical junction and C1-2 relationship   otherwise appear normal.    Disc levels:    C2-3: There is no significant bony stenosis of the spinal canal or neural   foramina.    C3-4: Mild disc bulge. Left facet hypertrophy. No significant canal or foraminal   narrowing.    C4-5: Left facet and uncinate hypertrophy. Mild left neural foraminal narrowing.    C5-6: Mild disc bulge and endplate osteophytes. Facet and uncinate hypertrophy.   Moderate right and mild to moderate left neural foraminal narrowing.    C6-7: Endplate osteophytes. Facet and uncinate hypertrophy. Moderate to severe   left and moderate right neural foraminal narrowing.    C7-T1: Facet hypertrophy. Mild bilateral neural foraminal narrowing.    Soft tissues:    Prevertebral soft tissues are normal in thickness. No abnormal fluid collection   or hematoma identified in the paraspinal soft tissues.        Impression    CT HEAD IMPRESSION:    1. No acute intracranial abnormality is identified.    2. Mild generalized brain volume loss and moderate small vessel ischemic   changes.      CT FACIAL BONES IMPRESSION:    1. No acute facial bone fracture is identified.    2. Upper lip laceration.    3. Left facial soft tissue contusion.      CT CERVICAL SPINE IMPRESSION:    1. No acute cervical spine fracture is identified.    2. Degenerative changes as detailed above.                Serafina MitchellPerry Stevens, M.D.   06/21/2021 10:59:00 PM   XR ELBOW LEFT (MIN 3 VIEWS)    Narrative    EXAMINATION: XR ELBOW LEFT (MIN 3 VIEWS)    DATE: 06/21/2021 9:45 PM  SLOT:  60      HISTORY: pain     COMPARISON: None.    FINDINGS:    There is a fracture of the capitellum, which is dislocated from the radial head.   The  articular surface of the capitellum is rotated by approximately and 90   degrees and oriented cephalad. There is soft tissue swelling and a joint   effusion. No unexpected radiodense foreign body.         Impression    1.  Fracture dislocation of the capitellum, as above. CT can be considered for   preoperative planning and to exclude additional injuries, as clinically   necessary.                Thornton DalesMichael Letzing, M.D.   06/21/2021 10:12:00 PM   CT ELBOW LEFT WO CONTRAST    Narrative    Preliminary report, full dictation to follow:    CT of the left elbow without contrast    INDICATION: Fall    COMPARISON: Radiographs from the same day    TECHNIQUE: CT of the left elbow was performed without the administration of   intravenous contrast. Coronal and sagittal reformatted images were critical from   the data set. CT dose lowering techniques were used, to include: automated   exposure control, adjustment for patient size, and / or use of iterative   reconstruction.    FINDINGS:  There is marked comminution of the capitellum and capitellar articular surface.   The dominant component of the articular surface is dislocated anteriorly   relative to the radial head. A fracture line approaches the  coronoid fossa and   extends to the trochlear articular surface. The proximal ulna and radius appear   intact. An elbow joint effusion is present. There is no soft tissue gas or   radiopaque foreign body.      Impression    Marked comminution of the distal humerus as described.    Preliminary report by Doyce Loose, MD.        CT ELBOW LEFT WO CONTRAST   Preliminary Result   Marked comminution of the distal humerus as described.      Preliminary report by Doyce Loose, MD.      CT HEAD WO CONTRAST   Final Result      CT HEAD IMPRESSION:      1. No acute intracranial abnormality is identified.      2. Mild generalized brain volume loss and moderate small vessel ischemic    changes.         CT FACIAL BONES IMPRESSION:      1. No acute  facial bone fracture is identified.      2. Upper lip laceration.      3. Left facial soft tissue contusion.         CT CERVICAL SPINE IMPRESSION:      1. No acute cervical spine fracture is identified.      2. Degenerative changes as detailed above.                       Serafina Mitchell, M.D.    06/21/2021 10:59:00 PM      CT CERVICAL SPINE WO CONTRAST   Final Result      CT HEAD IMPRESSION:      1. No acute intracranial abnormality is identified.      2. Mild generalized brain volume loss and moderate small vessel ischemic    changes.         CT FACIAL BONES IMPRESSION:      1. No acute facial bone fracture is identified.      2. Upper lip laceration.      3. Left facial soft tissue contusion.         CT CERVICAL SPINE IMPRESSION:      1. No acute cervical spine fracture is identified.      2. Degenerative changes as detailed above.                       Serafina Mitchell, M.D.    06/21/2021 10:59:00 PM      CT FACIAL BONES WO CONTRAST   Final Result      CT HEAD IMPRESSION:      1. No acute intracranial abnormality is identified.      2. Mild generalized brain volume loss and moderate small vessel ischemic    changes.         CT FACIAL BONES IMPRESSION:      1. No acute facial bone fracture is identified.      2. Upper lip laceration.      3. Left facial soft tissue contusion.         CT CERVICAL SPINE IMPRESSION:      1. No acute cervical spine fracture is identified.      2. Degenerative changes as detailed above.                       Serafina Mitchell, M.D.    06/21/2021 10:59:00 PM  XR ELBOW LEFT (MIN 3 VIEWS)   Final Result      1.  Fracture dislocation of the capitellum, as above. CT can be considered for    preoperative planning and to exclude additional injuries, as clinically    necessary.                      Thornton Dales, M.D.    06/21/2021 10:12:00 PM                          Voice dictation software was used during the making of this note.  This software is not perfect and grammatical and other typographical  errors may be present.  This note has not been completely proofread for errors.     Alesia Richards, [place]  06/22/21 [phone]

## 2021-06-21 NOTE — Discharge Instructions (Addendum)
As discussed, you has some injuries that require follow-up.  Chiefly, your left elbow will need surgical repair (open reduction, internal fixation or ORIF surgery).  I have discussed your case with our on-call orthopedic, his information has been attached to your discharge paperwork. Call his office tomorrow afternoon if you have not yet heard from them. I have given you pain medication to use as needed for your left elbow pain and facial pain.  If you choose to take them, take them with a stool softener to prevent constipation.    I placed 4 stitches to repair your lip laceration.  2 of these will need to be removed, 2 of them will dissolve on their own.  The dissolvable sutures were placed on an inner lip, the nonabsorbable were placed outside of your lip.    I have attached plastic surgery to your discharge paperwork as well, you may follow-up with them only as needed if you choose to discuss some scar revision in the future.    Thank you for allowing me to take care of you this evening.  Merlyn Lot

## 2021-06-22 ENCOUNTER — Inpatient Hospital Stay
Admit: 2021-06-22 | Discharge: 2021-06-22 | Disposition: A | Discharge status: Home / Self Care | Payer: MEDICARE | Attending: Emergency Medicine

## 2021-06-22 MED ORDER — DOCUSATE SODIUM 100 MG PO CAPS
100 MG | ORAL_CAPSULE | Freq: Two times a day (BID) | ORAL | 0 refills | Status: DC
Start: 2021-06-22 — End: 2021-06-23

## 2021-06-22 MED ORDER — LIDOCAINE HCL 1 % IJ SOLN
1 % | INTRAMUSCULAR | Status: AC
Start: 2021-06-22 — End: 2021-06-22
  Administered 2021-06-22: 05:00:00 5 mL via INTRADERMAL

## 2021-06-22 MED ORDER — HYDROCODONE-ACETAMINOPHEN 5-325 MG PO TABS
5-325 MG | ORAL | Status: AC
Start: 2021-06-22 — End: 2021-06-22
  Administered 2021-06-22: 05:00:00 1 via ORAL

## 2021-06-22 MED ORDER — HYDROCODONE-ACETAMINOPHEN 5-325 MG PO TABS
5-325 MG | ORAL_TABLET | ORAL | 0 refills | Status: DC | PRN
Start: 2021-06-22 — End: 2021-06-24

## 2021-06-22 MED FILL — HYDROCODONE-ACETAMINOPHEN 5-325 MG PO TABS: 5-325 MG | ORAL | Qty: 1

## 2021-06-22 MED FILL — LIDOCAINE HCL 1 % IJ SOLN: 1 % | INTRAMUSCULAR | Qty: 10

## 2021-06-22 NOTE — ED Notes (Signed)
I have reviewed discharge instructions with the patient.  The patient verbalized understanding.    Patient left ED via Discharge Method: ambulatory to Home with family.    Opportunity for questions and clarification provided.       Patient given 2 scripts.         To continue your aftercare when you leave the hospital, you may receive an automated call from our care team to check in on how you are doing.  This is a free service and part of our promise to provide the best care and service to meet your aftercare needs.??? If you have questions, or wish to unsubscribe from this service please call [phone].  Thank you for Choosing our [facility] Emergency Department.          Thurnell Lose, RN  06/22/21 0030

## 2021-06-22 NOTE — ED Notes (Signed)
RN applied shoulder immbolizer to left shoulder     Thurnell Lose, RN  06/22/21 [phone]

## 2021-06-23 ENCOUNTER — Ambulatory Visit
Admit: 2021-06-23 | Discharge: 2021-06-23 | Payer: MEDICARE | Attending: Orthopaedic Surgery | Primary: Internal Medicine

## 2021-06-23 DIAGNOSIS — S42452A Displaced fracture of lateral condyle of left humerus, initial encounter for closed fracture: Secondary | ICD-10-CM

## 2021-06-23 NOTE — Progress Notes (Signed)
a         History and physical    Patient: Debbie Gonzalez MRN: [id number]  SSN: [id number]    Date of Birth: [birth year]  Age: 66 y.o.  Sex: female      Subjective:      Debbie Gonzalez is a 66 y.o. female who fell from a standing height and landed awkwardly on her left upper extremity.  She was seen in the emergency room and found to have a left capitellum fracture.  She is here today for follow-up.  He is very healthy individual has very minimal past medical history.  Takes very little medication and her only past surgical history is some elective foot surgery that she had years ago.  He does not take any blood thinners.  She has no history of any issues with her heart or lungs..    No past medical history on file.  No past surgical history on file.   FAMHX -No history of inflammatory arthritis   Social History     Tobacco Use    Smoking status: Never    Smokeless tobacco: Never   Substance Use Topics    Alcohol use: Yes      Current Outpatient Medications   Medication Sig Dispense Refill    ibuprofen (ADVIL;MOTRIN) 200 MG tablet Take 200 mg by mouth every 6 hours as needed for Pain      Multiple Vitamins-Minerals (PRESERVISION AREDS 2+MULTI VIT PO) Take by mouth      sertraline (ZOLOFT) 25 MG tablet Take 25 mg by mouth daily      HYDROcodone-acetaminophen (LORCET) 5-325 MG per tablet Take 1 tablet by mouth every 4 hours as needed for Pain for up to 3 days. Intended supply: 3 days. Take lowest dose possible to manage pain Max Daily Amount: 6 tablets 12 tablet 0    docusate sodium (COLACE) 100 MG capsule Take 1 capsule by mouth 2 times daily For use with opioid pain medications. 28 capsule 0     No current facility-administered medications for this visit.        No Known Allergies    Review of Systems:  A comprehensive review of systems was negative.    Objective:     There were no vitals filed for this visit.     Physical Exam:  Physical Exam:  General:  Alert, cooperative, no distress, appears stated age. Orientation she  is alert and oriented person place time and situation   Eyes:  Conjunctivae/corneas clear. PERRL, EOMs intact. Fundi benign   Ears:  Normal TMs and external ear canals both ears.   Nose: Nares normal. Septum midline. Mucosa normal. No drainage or sinus tenderness.   Mouth/Throat: Lips, mucosa, and tongue normal. Teeth and gums normal.   Neck: Supple, symmetrical, trachea midline, no adenopathy, thyroid: no enlargment/tenderness/nodules, no carotid bruit and no JVD.   Back:   Symmetric, no curvature. ROM normal. No CVA tenderness.   Lungs:   Clear to auscultation bilaterally.   Heart:  Regular rate and rhythm, S1, S2 normal, no murmur, click, rub or gallop.   Abdomen:   Soft, non-tender. Bowel sounds normal. No masses,  No organomegaly.       No lymphadenopathy in all 4 extremities  Alignment-she has only mild deformity of her left elbow  Range of motion-pain with any range of motion of the left elbow  Vascular-distal pulses palpable in left upper extremity  Sensory/motor-deep tendon reflexes normal left upper extremity.  Motor and sensory function  intact.  Stability-is difficult to assess stability because the presence of her fracture in her left elbow  Tenderness to palpation throughout the left elbow area  Skin-she has no open wounds she has no fracture blisters.  She does have a good bit of swelling in her left elbow area  Gait-normal    Assessment:     @HPROB @  Left closed displaced intra-articular capitellum fracture    Xrays and or studies:    I have reviewed her x-rays as well as her CT scan which shows a left capitellum fracture of her distal humerus  Plan:     I have spoke with the patient regarding the different treatment options.  I do think that she likely has an injury that would probably best be treated with open reduction internal fixation through an olecranon osteotomy.  I explained what this would involve.  I used illustrations to help him understand this.  After talking her about this she seemed to  feel comfortable consenting.  The plan will be to proceed with open reduction internal fixation of left capitellum and distal humerus fracture through an olecranon osteotomy.    Signed By: , MD     June 23, 2021

## 2021-06-23 NOTE — Other (Signed)
Patient verified name and DOB.  Order for consent  found in EHR and matches case posting; patient verifies procedure.     Type 2 surgery, Phone assessment complete.  Orders  received.  Labs per surgeon: none  Labs per anesthesia protocol: Hgb and T/S DOS.    Patient answered medical/surgical history questions at their best of ability. All prior to admission medications documented in [facility].  Patient instructed to take the following medications the day of surgery according to anesthesia guidelines with a small sip of water: Zoloft. Hold all vitamins 7 days prior to surgery and NSAIDS 5 days prior to surgery. Prescription meds to hold:Vitamin E, Vitamin D, Preservision, Multivitamin, Ibuprofen, and Viactiv.    Patient instructed on the following:    > Arrive at [employer], time of arrival to be called the day before by 1700  > NPO after midnight, unless otherwise indicated, including gum, mints, and ice chips  > Responsible adult must drive patient to the hospital, stay during surgery, and patient will need supervision 24 hours after anesthesia  > Use antibacterial Soap in shower the night before surgery and on the morning of surgery  > All piercings must be removed prior to arrival.    > Leave all valuables (money and jewelry) at home but bring insurance card and ID on DOS.   > You may be required to pay a deductible or co-pay on the day of your procedure. You can pre-pay by calling [phone] if your surgery is at the [facility] or [phone] if your surgery is at the [facility].  > Do not wear make-up, nail polish, lotions, cologne, perfumes, powders, or oil on skin. Artificial nails are not permitted.

## 2021-06-23 NOTE — H&P (Signed)
History and physical    Patient: Debbie Gonzalez MRN: [id number]  SSN: [id number]    Date of Birth: May 02, 1956  Age: 66 y.o.  Sex: female      Subjective:      Debbie Gonzalez is a 66 y.o. female who fell from a standing height and landed awkwardly on her left upper extremity.  She was seen in the emergency room and found to have a left capitellum fracture.  She is here today for follow-up.  He is very healthy individual has very minimal past medical history.  Takes very little medication and her only past surgical history is some elective foot surgery that she had years ago.  He does not take any blood thinners.  She has no history of any issues with her heart or lungs..    No past medical history on file.  No past surgical history on file.   FAMHX -No history of inflammatory arthritis   Social History     Tobacco Use    Smoking status: Never    Smokeless tobacco: Never   Substance Use Topics    Alcohol use: Yes      Current Outpatient Medications   Medication Sig Dispense Refill    ibuprofen (ADVIL;MOTRIN) 200 MG tablet Take 200 mg by mouth every 6 hours as needed for Pain      Multiple Vitamins-Minerals (PRESERVISION AREDS 2+MULTI VIT PO) Take by mouth      sertraline (ZOLOFT) 25 MG tablet Take 25 mg by mouth daily      HYDROcodone-acetaminophen (LORCET) 5-325 MG per tablet Take 1 tablet by mouth every 4 hours as needed for Pain for up to 3 days. Intended supply: 3 days. Take lowest dose possible to manage pain Max Daily Amount: 6 tablets 12 tablet 0    docusate sodium (COLACE) 100 MG capsule Take 1 capsule by mouth 2 times daily For use with opioid pain medications. 28 capsule 0     No current facility-administered medications for this visit.        No Known Allergies    Review of Systems:  A comprehensive review of systems was negative.    Objective:     There were no vitals filed for this visit.     Physical Exam:  Physical Exam:  General:  Alert, cooperative, no distress, appears stated age. Orientation she is alert  and oriented person place time and situation   Eyes:  Conjunctivae/corneas clear. PERRL, EOMs intact. Fundi benign   Ears:  Normal TMs and external ear canals both ears.   Nose: Nares normal. Septum midline. Mucosa normal. No drainage or sinus tenderness.   Mouth/Throat: Lips, mucosa, and tongue normal. Teeth and gums normal.   Neck: Supple, symmetrical, trachea midline, no adenopathy, thyroid: no enlargment/tenderness/nodules, no carotid bruit and no JVD.   Back:   Symmetric, no curvature. ROM normal. No CVA tenderness.   Lungs:   Clear to auscultation bilaterally.   Heart:  Regular rate and rhythm, S1, S2 normal, no murmur, click, rub or gallop.   Abdomen:   Soft, non-tender. Bowel sounds normal. No masses,  No organomegaly.       No lymphadenopathy in all 4 extremities  Alignment-she has only mild deformity of her left elbow  Range of motion-pain with any range of motion of the left elbow  Vascular-distal pulses palpable in left upper extremity  Sensory/motor-deep tendon reflexes normal left upper extremity.  Motor and sensory function intact.  Stability-is difficult to assess stability because the  presence of her fracture in her left elbow  Tenderness to palpation throughout the left elbow area  Skin-she has no open wounds she has no fracture blisters.  She does have a good bit of swelling in her left elbow area  Gait-normal    Assessment:     @HPROB @  Left closed displaced intra-articular capitellum fracture    Xrays and or studies:    I have reviewed her x-rays as well as her CT scan which shows a left capitellum fracture of her distal humerus  Plan:     I have spoke with the patient regarding the different treatment options.  I do think that she likely has an injury that would probably best be treated with open reduction internal fixation through an olecranon osteotomy.  I explained what this would involve.  I used illustrations to help him understand this.  After talking her about this she seemed to feel  comfortable consenting.  The plan will be to proceed with open reduction internal fixation of left capitellum and distal humerus fracture through an olecranon osteotomy.    Signed By: , MD     June 23, 2021

## 2021-06-23 NOTE — H&P (View-Only) (Signed)
History and physical    Patient: Debbie Gonzalez MRN: [id number]  SSN: [id number]    Date of Birth: 03/12/1956  Age: 66 y.o.  Sex: female      Subjective:      Debbie Gonzalez is a 66 y.o. female who fell from a standing height and landed awkwardly on her left upper extremity.  She was seen in the emergency room and found to have a left capitellum fracture.  She is here today for follow-up.  He is very healthy individual has very minimal past medical history.  Takes very little medication and her only past surgical history is some elective foot surgery that she had years ago.  He does not take any blood thinners.  She has no history of any issues with her heart or lungs..    No past medical history on file.  No past surgical history on file.   FAMHX -No history of inflammatory arthritis   Social History     Tobacco Use    Smoking status: Never    Smokeless tobacco: Never   Substance Use Topics    Alcohol use: Yes      Current Outpatient Medications   Medication Sig Dispense Refill    ibuprofen (ADVIL;MOTRIN) 200 MG tablet Take 200 mg by mouth every 6 hours as needed for Pain      Multiple Vitamins-Minerals (PRESERVISION AREDS 2+MULTI VIT PO) Take by mouth      sertraline (ZOLOFT) 25 MG tablet Take 25 mg by mouth daily      HYDROcodone-acetaminophen (LORCET) 5-325 MG per tablet Take 1 tablet by mouth every 4 hours as needed for Pain for up to 3 days. Intended supply: 3 days. Take lowest dose possible to manage pain Max Daily Amount: 6 tablets 12 tablet 0    docusate sodium (COLACE) 100 MG capsule Take 1 capsule by mouth 2 times daily For use with opioid pain medications. 28 capsule 0     No current facility-administered medications for this visit.        No Known Allergies    Review of Systems:  A comprehensive review of systems was negative.    Objective:     There were no vitals filed for this visit.     Physical Exam:  Physical Exam:  General:  Alert, cooperative, no distress, appears stated age. Orientation she is alert  and oriented person place time and situation   Eyes:  Conjunctivae/corneas clear. PERRL, EOMs intact. Fundi benign   Ears:  Normal TMs and external ear canals both ears.   Nose: Nares normal. Septum midline. Mucosa normal. No drainage or sinus tenderness.   Mouth/Throat: Lips, mucosa, and tongue normal. Teeth and gums normal.   Neck: Supple, symmetrical, trachea midline, no adenopathy, thyroid: no enlargment/tenderness/nodules, no carotid bruit and no JVD.   Back:   Symmetric, no curvature. ROM normal. No CVA tenderness.   Lungs:   Clear to auscultation bilaterally.   Heart:  Regular rate and rhythm, S1, S2 normal, no murmur, click, rub or gallop.   Abdomen:   Soft, non-tender. Bowel sounds normal. No masses,  No organomegaly.       No lymphadenopathy in all 4 extremities  Alignment-she has only mild deformity of her left elbow  Range of motion-pain with any range of motion of the left elbow  Vascular-distal pulses palpable in left upper extremity  Sensory/motor-deep tendon reflexes normal left upper extremity.  Motor and sensory function intact.  Stability-is difficult to assess stability because the   presence of her fracture in her left elbow  Tenderness to palpation throughout the left elbow area  Skin-she has no open wounds she has no fracture blisters.  She does have a good bit of swelling in her left elbow area  Gait-normal    Assessment:     @HPROB @  Left closed displaced intra-articular capitellum fracture    Xrays and or studies:    I have reviewed her x-rays as well as her CT scan which shows a left capitellum fracture of her distal humerus  Plan:     I have spoke with the patient regarding the different treatment options.  I do think that she likely has an injury that would probably best be treated with open reduction internal fixation through an olecranon osteotomy.  I explained what this would involve.  I used illustrations to help him understand this.  After talking her about this she seemed to feel  comfortable consenting.  The plan will be to proceed with open reduction internal fixation of left capitellum and distal humerus fracture through an olecranon osteotomy.    Signed By: , MD     June 23, 2021

## 2021-06-24 ENCOUNTER — Ambulatory Visit: Admit: 2021-06-24 | Payer: MEDICARE | Primary: Diagnostic Radiology

## 2021-06-24 ENCOUNTER — Inpatient Hospital Stay: Payer: MEDICARE

## 2021-06-24 LAB — TYPE AND SCREEN
ABO/Rh: A POS
Antibody Screen: NEGATIVE

## 2021-06-24 LAB — HEMOGLOBIN: Hemoglobin: 12.4 g/dL (ref 11.7–15.4)

## 2021-06-24 MED ORDER — HYDROMORPHONE HCL 2 MG/ML IJ SOLN
2 MG/ML | INTRAMUSCULAR | Status: DC | PRN
Start: 2021-06-24 — End: 2021-06-24
  Administered 2021-06-24: 23:00:00 0.5 mg via INTRAVENOUS

## 2021-06-24 MED ORDER — LIDOCAINE HCL (PF) 2 % IJ SOLN
2 | INTRAMUSCULAR | Status: AC
Start: 2021-06-24 — End: ?

## 2021-06-24 MED ORDER — NORMAL SALINE FLUSH 0.9 % IV SOLN
0.9 % | Freq: Two times a day (BID) | INTRAVENOUS | Status: DC
Start: 2021-06-24 — End: 2021-06-24

## 2021-06-24 MED ORDER — LACTATED RINGERS IV SOLN
INTRAVENOUS | Status: DC
Start: 2021-06-24 — End: 2021-06-24
  Administered 2021-06-24: 14:00:00 via INTRAVENOUS

## 2021-06-24 MED ORDER — MIDAZOLAM HCL (PF) 2 MG/2ML IJ SOLN
22 MG/ML | Freq: Once | INTRAMUSCULAR | Status: AC | PRN
Start: 2021-06-24 — End: 2021-06-24
  Administered 2021-06-24: 14:00:00 2 mg via INTRAVENOUS

## 2021-06-24 MED ORDER — PROPOFOL 200 MG/20ML IV EMUL
20020 MG/20ML | INTRAVENOUS | Status: AC
Start: 2021-06-24 — End: ?

## 2021-06-24 MED ORDER — LACTATED RINGERS IV SOLN
INTRAVENOUS | Status: DC
Start: 2021-06-24 — End: 2021-06-24

## 2021-06-24 MED ORDER — PROPOFOL 200 MG/20ML IV EMUL
200 MG/20ML | INTRAVENOUS | Status: AC
Start: 2021-06-24 — End: ?

## 2021-06-24 MED ORDER — SCOPOLAMINE 1 MG/3DAYS TD PT72
1 MG/3DAYS | TRANSDERMAL | Status: DC
Start: 2021-06-24 — End: 2021-06-24
  Administered 2021-06-24: 14:00:00 1 via TRANSDERMAL

## 2021-06-24 MED ORDER — FENTANYL CITRATE (PF) 100 MCG/2ML IJ SOLN
100 | INTRAMUSCULAR | Status: DC | PRN
Start: 2021-06-24 — End: 2021-06-24

## 2021-06-24 MED ORDER — PROPOFOL 200 MG/20ML IV EMUL
200 MG/20ML | INTRAVENOUS | Status: DC | PRN
Start: 2021-06-24 — End: 2021-06-24
  Administered 2021-06-24: 17:00:00 50 via INTRAVENOUS

## 2021-06-24 MED ORDER — METOCLOPRAMIDE HCL 5 MG/ML IJ SOLN
5 MG/ML | Freq: Once | INTRAMUSCULAR | Status: DC | PRN
Start: 2021-06-24 — End: 2021-06-24

## 2021-06-24 MED ORDER — OXYCODONE-ACETAMINOPHEN 5-325 MG PO TABS
5-325 | ORAL_TABLET | ORAL | 0 refills | Status: AC | PRN
Start: 2021-06-24 — End: 2021-06-29

## 2021-06-24 MED ORDER — LIDOCAINE-EPINEPHRINE 1 %-1:200000 IJ SOLN
1 | INTRAMUSCULAR | Status: AC
Start: 2021-06-24 — End: ?

## 2021-06-24 MED ORDER — CEFAZOLIN 2000 MG IN 20 ML SWFI IV SYRINGE (PREMIX)
Status: AC
Start: 2021-06-24 — End: 2021-06-24
  Administered 2021-06-24: 17:00:00 2 mg via INTRAVENOUS

## 2021-06-24 MED ORDER — BUPIVACAINE-EPINEPHRINE 0.5% -1:200000 IJ SOLN
INTRAMUSCULAR | Status: AC
Start: 2021-06-24 — End: ?

## 2021-06-24 MED ORDER — ONDANSETRON HCL 4 MG/2ML IJ SOLN
4 MG/2ML | Freq: Once | INTRAMUSCULAR | Status: DC | PRN
Start: 2021-06-24 — End: 2021-06-24

## 2021-06-24 MED ORDER — OXYCODONE HCL 5 MG PO TABS
5 MG | Freq: Once | ORAL | Status: AC | PRN
Start: 2021-06-24 — End: 2021-06-24
  Administered 2021-06-24: 23:00:00 5 mg via ORAL

## 2021-06-24 MED ORDER — BUPIVACAINE-EPINEPHRINE (PF) 0.5% -1:200000 IJ SOLN
INTRAMUSCULAR | Status: AC
Start: 2021-06-24 — End: 2021-06-24
  Administered 2021-06-24: 14:00:00 30 via PERINEURAL

## 2021-06-24 MED ORDER — FENTANYL CITRATE (PF) 100 MCG/2ML IJ SOLN
100 MCG/2ML | Freq: Once | INTRAMUSCULAR | Status: DC | PRN
Start: 2021-06-24 — End: 2021-06-24

## 2021-06-24 MED ORDER — DIPHENHYDRAMINE HCL 50 MG/ML IJ SOLN
50 | Freq: Once | INTRAMUSCULAR | Status: DC | PRN
Start: 2021-06-24 — End: 2021-06-24

## 2021-06-24 MED ORDER — PROPOFOL 200 MG/20ML IV EMUL
20020 MG/20ML | INTRAVENOUS | Status: DC | PRN
Start: 2021-06-24 — End: 2021-06-24
  Administered 2021-06-24: 17:00:00 140 via INTRAVENOUS

## 2021-06-24 MED ORDER — LIDOCAINE HCL (PF) 2 % IJ SOLN
2 % | INTRAMUSCULAR | Status: DC | PRN
Start: 2021-06-24 — End: 2021-06-24
  Administered 2021-06-24: 17:00:00 100 via INTRAVENOUS

## 2021-06-24 MED ORDER — PROPOFOL 200 MG/20ML IV EMUL
200 | INTRAVENOUS | Status: AC
Start: 2021-06-24 — End: ?

## 2021-06-24 MED FILL — DIPRIVAN 200 MG/20ML IV EMUL: 200 MG/20ML | INTRAVENOUS | Qty: 20

## 2021-06-24 MED FILL — CEFAZOLIN 2000 MG IN 20 ML SWFI IV SYRINGE (PREMIX): Qty: 2000

## 2021-06-24 MED FILL — SCOPOLAMINE 1 MG/3DAYS TD PT72: 1 MG/3DAYS | TRANSDERMAL | Qty: 1

## 2021-06-24 MED FILL — MIDAZOLAM HCL 2 MG/2ML IJ SOLN: 2 MG/ML | INTRAMUSCULAR | Qty: 2

## 2021-06-24 MED FILL — SENSORCAINE/EPINEPHRINE 0.5% -1:200000 IJ SOLN: INTRAMUSCULAR | Qty: 50

## 2021-06-24 MED FILL — HYDROMORPHONE HCL 2 MG/ML IJ SOLN: 2 MG/ML | INTRAMUSCULAR | Qty: 1

## 2021-06-24 MED FILL — OXYCODONE HCL 5 MG PO TABS: 5 MG | ORAL | Qty: 1

## 2021-06-24 MED FILL — XYLOCAINE-MPF/EPINEPHRINE 1 %-1:200000 IJ SOLN: 1 %-:200000 | INTRAMUSCULAR | Qty: 30

## 2021-06-24 MED FILL — LIDOCAINE HCL (PF) 2 % IJ SOLN: 2 % | INTRAMUSCULAR | Qty: 5

## 2021-06-24 NOTE — Anesthesia Pre-Procedure Evaluation (Signed)
Department of Anesthesiology  Preprocedure Note       Name:  Debbie Gonzalez   Age:  66 y.o.  DOB:  [birth year]                                          MRN:  [id number]         Date:  06/24/2021      Surgeon: Moishe Spice):  Trudi Ida, MD    Procedure: Procedure(s):  ORIF LEFT CAPITELLUM FX W/OLECRANON OSTEOTOMY    Medications prior to admission:   Prior to Admission medications    Medication Sig Start Date End Date Taking? Authorizing Provider   docusate sodium (COLACE) 100 MG capsule Take 100 mg by mouth as needed for Constipation   Yes Historical Provider, MD   sertraline (ZOLOFT) 100 MG tablet Take 100 mg by mouth daily   Yes Historical Provider, MD   Multiple Vitamin (MULTIVITAMIN ADULT PO) Take by mouth nightly   Yes Historical Provider, MD   VITAMIN D PO Take by mouth nightly   Yes Historical Provider, MD   VITAMIN E PO Take by mouth nightly   Yes Historical Provider, MD   ferrous sulfate (IRON 325) 325 (65 Fe) MG tablet Take 325 mg by mouth nightly   Yes Historical Provider, MD   Calcium-Vitamin D-Vitamin K (VIACTIV PO) Take by mouth nightly   Yes Historical Provider, MD   ibuprofen (ADVIL;MOTRIN) 200 MG tablet Take 200 mg by mouth every 6 hours as needed for Pain    Historical Provider, MD   Multiple Vitamins-Minerals (PRESERVISION AREDS 2+MULTI VIT PO) Take 1 tablet by mouth in the morning and at bedtime    Historical Provider, MD   HYDROcodone-acetaminophen (LORCET) 5-325 MG per tablet Take 1 tablet by mouth every 4 hours as needed for Pain for up to 3 days. Intended supply: 3 days. Take lowest dose possible to manage pain Max Daily Amount: 6 tablets 06/21/21 06/24/21  Alesia Richards, PA       Current medications:    Current Facility-Administered Medications   Medication Dose Route Frequency Provider Last Rate Last Admin   . fentaNYL (SUBLIMAZE) injection 100 mcg  100 mcg IntraVENous Once PRN Waymond Cera, MD       . scopolamine (TRANSDERM-SCOP) transdermal patch 1 patch  1 patch TransDERmal Q72H Waymond Cera, MD   1 patch at 06/24/21 [phone]   . lactated ringers IV soln infusion   IntraVENous Continuous Waymond Cera, MD 125 mL/hr at 06/24/21 0855 New Bag at 06/24/21 0855   . sodium chloride flush 0.9 % injection 5-40 mL  5-40 mL IntraVENous 2 times per day Waymond Cera, MD       . ceFAZolin (ANCEF) 2000 mg in sterile water 20 mL IV syringe  2,000 mg IntraVENous On Call to OR Trudi Ida, MD           Allergies:  No Known Allergies    Problem List:    Patient Active Problem List   Diagnosis Code   . Closed fracture of capitellum of distal humerus, left, initial encounter S42.452A       Past Medical History:        Diagnosis Date   . Anxiety    . Closed fracture of capitellum of distal humerus, left, initial encounter        Past Surgical  History:        Procedure Laterality Date   . COLONOSCOPY     . CYST REMOVAL Left     top of left foot   . TUBAL LIGATION     . WISDOM TOOTH EXTRACTION         Social History:    Social History     Tobacco Use   . Smoking status: Never   . Smokeless tobacco: Never   Substance Use Topics   . Alcohol use: Yes     Alcohol/week: 10.0 standard drinks     Types: 10 Cans of beer per week                                Counseling given: Not Answered      Vital Signs (Current):   Vitals:    06/23/21 1139 06/24/21 0844 06/24/21 0927 06/24/21 0932   BP:  (!) 110/59 (!) 119/58 (!) 118/59   Pulse:  78 73    Resp:  16 18 18    Temp:  97.7 F (36.5 C)     TempSrc:  Oral     SpO2:  96% 99% 100%   Weight: 125 lb (56.7 kg) 125 lb (56.7 kg)     Height: 5' 8.5" (1.74 m)                                                 BP Readings from Last 3 Encounters:   06/24/21 (!) 118/59   06/21/21 107/67       NPO Status: Time of last liquid consumption: 2100                        Time of last solid consumption: 2100                        Date of last liquid consumption: 06/23/21                        Date of last solid food consumption: 06/23/21    BMI:   Wt Readings from Last 3 Encounters:    06/24/21 125 lb (56.7 kg)   06/21/21 125 lb (56.7 kg)     Body mass index is 18.73 kg/m.    CBC:   Lab Results   Component Value Date/Time    HGB 12.4 06/24/2021 08:39 AM       CMP: No results found for: NA, K, CL, CO2, BUN, CREATININE, GFRAA, AGRATIO, LABGLOM, GLUCOSE, GLU, PROT, CALCIUM, BILITOT, ALKPHOS, AST, ALT    POC Tests: No results for input(s): POCGLU, POCNA, POCK, POCCL, POCBUN, POCHEMO, POCHCT in the last 72 hours.    Coags: No results found for: PROTIME, INR, APTT    HCG (If Applicable): No results found for: PREGTESTUR, PREGSERUM, HCG, HCGQUANT     ABGs: No results found for: PHART, PO2ART, PCO2ART, HCO3ART, BEART, O2SATART     Type & Screen (If Applicable):  No results found for: LABABO, LABRH    Drug/Infectious Status (If Applicable):  No results found for: HIV, HEPCAB    COVID-19 Screening (If Applicable): No results found for: COVID19        Anesthesia Evaluation  Patient summary reviewed and  Nursing notes reviewed  Airway: Mallampati: II  TM distance: >3 FB   Neck ROM: full     Dental:          Pulmonary:Negative Pulmonary ROS and normal exam                               Cardiovascular:  Exercise tolerance: good (>4 METS),           Rhythm: regular  Rate: normal                    Neuro/Psych:   Negative Neuro/Psych ROS              GI/Hepatic/Renal: Neg GI/Hepatic/Renal ROS            Endo/Other: Negative Endo/Other ROS             Pt had no PAT visit       Abdominal:             Vascular: negative vascular ROS.         Other Findings:           Anesthesia Plan      TIVA and regional     ASA 2       Induction: intravenous.    MIPS: Postoperative opioids intended and Prophylactic antiemetics administered.  Anesthetic plan and risks discussed with patient.      Plan discussed with surgical team.                    Waymond Cera, MD   06/24/2021

## 2021-06-24 NOTE — Addendum Note (Signed)
Addendum  created 06/24/21 1622 by Waymond Cera, MD    Delete clinical note

## 2021-06-24 NOTE — Discharge Instructions (Addendum)
Nonweightbearing on operative extremity, No use of upper extremity, Sling at all times, No heavy lifting operative extremity, Leave dressing intact until follow-up, and Resume regular diet     MEDICATION INTERACTION:  During your procedure you potentially received a medication or medications which may reduce the effectiveness of oral contraceptives. Please consider other forms of contraception for 1 month following your procedure if you are currently using oral contraceptives as your primary form of birth control. In addition to this, we recommend continuing your oral contraceptive as prescribed, unless otherwise instructed by your physician, during this time    After general anesthesia or intravenous sedation, for 24 hours or while taking prescription Narcotics:  Limit your activities  A responsible adult needs to be with you for the next 24 hours  Do not drive and operate hazardous machinery  Do not make important personal or business decisions  Do not drink alcoholic beverages  If you have not urinated within 8 hours after discharge, and you are experiencing discomfort from urinary retention, please go to the nearest ED.  If you have sleep apnea and have a CPAP machine, please use it for all naps and sleeping.  Please use caution when taking narcotics and any of your home medications that may cause drowsiness.  *  Please give a list of your current medications to your Primary Care Provider.  *  Please update this list whenever your medications are discontinued, doses are      changed, or new medications (including over-the-counter products) are added.  *  Please carry medication information at all times in case of emergency situations.    These are general instructions for a healthy lifestyle:  No smoking/ No tobacco products/ Avoid exposure to second hand smoke  Surgeon General's Warning:  Quitting smoking now greatly reduces serious risk to your health.  Obesity, smoking, and sedentary lifestyle greatly  increases your risk for illness  A healthy diet, regular physical exercise & weight monitoring are important for maintaining a healthy lifestyle    You may be retaining fluid if you have a history of heart failure or if you experience any of the following symptoms:  Weight gain of 3 pounds or more overnight or 5 pounds in a week, increased swelling in our hands or feet or shortness of breath while lying flat in bed.  Please call your doctor as soon as you notice any of these symptoms; do not wait until your next office visit.

## 2021-06-24 NOTE — Anesthesia Post-Procedure Evaluation (Deleted)
Department of Anesthesiology  Postprocedure Note    Patient: Debbie Gonzalez  MRN: [id number]  Birthdate: 1955/11/06  Date of evaluation: 06/24/2021      Procedure Summary     Date: 06/24/21 Room / Location: [facility] OR 07 / [facility]    Anesthesia Start: 1141 Anesthesia Stop: 1545    Procedure: ORIF LEFT CAPITELLUM FX W/OLECRANON OSTEOTOMY (Left: Elbow) Diagnosis:       Closed fracture of capitellum of distal humerus, left, initial encounter      (Closed fracture of capitellum of distal humerus, left, initial encounter [W23.762G])    Providers: Trudi Ida, MD Responsible Provider: Waymond Cera, MD    Anesthesia Type: General ASA Status: 2          Anesthesia Type: General    Aldrete Phase I: Aldrete Score: 9    Aldrete Phase II:        Anesthesia Post Evaluation    Patient location during evaluation: PACU  Patient participation: complete - patient participated  Level of consciousness: awake and awake and alert  Airway patency: patent  Nausea & Vomiting: no nausea  Complications: no  Cardiovascular status: hemodynamically stable  Respiratory status: acceptable  Hydration status: euvolemic  Multimodal analgesia pain management approach

## 2021-06-24 NOTE — Op Note (Signed)
Operative Report    Patient: Debbie Gonzalez MRN: [id number]  SSN: [id number]    Date of Birth: October 27, 1955  Age: 66 y.o.  Sex: female       Date of Surgery: June 24, 2021     History:  Debbie Gonzalez is a 66 y.o. female the scan showed that this was consistent with distal humerus fracture with a large portion of her capitellum involved.  Did not appear to go into the supracondylar area of the humerus.  I did have a chance to talk to her and her husband regarding different treatment options.  I thought the most reasonable thing would be to treat this with an olecranon osteotomy as I was concerned with the extensive intra-articular portion of the distal humerus I thought this would be the most reasonable way to treat this so that we could best treat the entire intra-articular nature of this injury and make sure we get an anatomic reduction.  I talked him about this.  They seem to feel comfortable consenting.      I talked to the patient and/or their representative and explained the exact nature the procedure.  I also went through a detailed list of the material risks associated with  the procedure which included risk of bleeding, infection, injury to nearby structures, worsening the situation, as well as the risks associate with anesthesia and finally death.  Also talked with him regarding the benefits and alternatives to the procedure.    Preoperative Diagnosis: Closed fracture of capitellum of distal humerus, left, initial encounter [S42.452A]     Postoperative Diagnosis:   Closed displaced intra-articular left distal humerus lateral condyle fracture      Surgeon(s) and Role:     * Trudi Ida, MD - Primary    Anesthesia: Choice     Procedure: Open reduction internal fixation of right lateral condyle distal humerus fracture with fixation of intra-articular capitellum fracture  Procedure in Detail:        After the successful duction of general anesthetic the left upper extremity was prepped and  draped in usual sterile fashion.  We utilized a sterile tourniquet.  I then made a posterior incision and curved this around the olecranon.  I was then able to expose the olecranon itself and the first thing I did was dissected out the ulnar nerve and make sure this was protected throughout the procedure.  I then proceeded to predrill the olecranon osteotomy site and then used an oscillating saw and a copious amount of irrigation while I performed the chevron olecranon osteotomy.  After this was done I was able to visualize the distal humerus.  There was a very large portion of the capitellum that I freed up as well as 2-3 smaller pieces of the capitellum and distal humerus.  This extended posterior into the lateral aspect of the distal humerus on the posterior aspect as well.  There do not appear to be fracture lines through the supracondylar area of the distal humerus this was really all articular so was able to reduce 2 of the 3 smaller fragments and held these reduced with K wires and then eventually the larger more anterior capitellum fragment was reduced and held with K wires as well.  I then was able to place multiple Synthes headless screws holding these fragments in place.  Once I had done this I did have a pretty significant defect in the distal humerus from the lateral aspect of the distal humerus there was a  large portion of the lateral epicondylar area that was comminuted and broken off and some of the lateral ligaments were attached to this so I placed some [place] on bone cement and allow this to harden.  After allowing this to harden I used some 0 Ethibond and then eventually a 200 wire suture to suture some of the lateral ligaments back down to the lateral aspect of the humerus.  I then began the process of repairing the olecranon osteotomy.  I placed my 7.3 cannulated screw and this appeared to be fairly uneventful and then was attempting to place the figure-of-eight tension band wire and the  reduction forceps that I was using to hold the olecranon in place I noticed they had sort of fallen off the fracture site and this led me the exam of the olecranon and the olecranon was in multiple fragments.  So I then abandon the cannulated screw and then the Synthes elbow plates were brought out and we used the Synthes olecranon plate to repair the olecranon osteotomy.  I did piece the proximal fragment olecranon together with multiple K wires and then placed my plate I also placed at least 1 lag screw as well using a 2.7 mm locking screw as a lag screw and then multiple locking screws through the plate itself as well as some cortical screws in the shaft portion of the plate.  At that point I judge the overall reduction of the olecranon osteotomy and I was pleased with this as well as the hardware.  The ulnohumeral joint had been a little unstable as we extended the elbow but as long as we held her 90 degrees of flexion it appeared that the ulnohumeral joint is very stable after I had repaired the lateral elbow ligaments.  I then closed with two-point oh strata fix suture for the subcutaneous tissue and staples for the skin and then placed the patient in a posterior splint making sure that she was flex to at least 90 degrees.  She was then awakened and taken recovery in stable condition      Estimated Blood Loss: 150 cc    Tourniquet Time:   Total Tourniquet Time Documented:  Arm  (Left) - 68 minutes  Arm  (Left) - 37 minutes  Total: Arm  (Left) - 105 minutes        Implants:   Implant Name Type Inv. Item Serial No. Manufacturer Lot No. LRB No. Used Action   GRAFT BNE SUB 10CC CA PHSPTE INJ DRILLABLE VOID FILL NORIAN - [id number]  GRAFT BNE SUB 10CC CA PHSPTE INJ DRILLABLE VOID FILL NORIAN  DEPUY SYNTHES [employer]WD [id number] Left 1 Implanted   WASHER ORTH DIA13MM FOR CANN SCR - [id number]  WASHER ORTH DIA13MM FOR CANN SCR  DEPUY SYNTHES [employer]WD [id number] Left 1 Implanted   SCREW BNE LNG THRD 2.5X40 MM 16 MM CANN  HDLSS TI - [id number]  SCREW BNE LNG THRD 2.5X40 MM 16 MM CANN HDLSS TI  DEPUY SYNTHES [employer]WD [id number] Left 1 Implanted   SCREW BNE 2X15 MM QUIK INSRTN TI NS - [id number]  SCREW BNE 2X15 MM QUIK INSRTN TI NS  DEPUY SYNTHES [employer]WD [id number] Left 1 Implanted   SCREW BNE 2X18 MM QUIK INSRTN TI NS - [id number]  SCREW BNE 2X18 MM QUIK INSRTN TI NS  DEPUY SYNTHES [employer]WD [id number] Left 1 Implanted   SCREW BNE L24MM DIA3.5MM CORT S STL ST NONCANNULATED LOK - [id number]  SCREW BNE L24MM DIA3.5MM CORT S STL ST NONCANNULATED LOK  DEPUY SYN[employer]ES [employer]-WD [id number] Left 1 Implanted   SCREW BNE L18MM DIA3.5MM CORT S STL ST LOK FULL THRD - [id number]  SCREW BNE L18MM DIA3.5MM CORT S STL ST LOK FULL THRD  DEPUY SYN[employer]WD [id number] Left 1 Implanted   PLATE BNE X32GM 2 H NONSTERILE L OLECRANON S STL LO PROF - [id number]  PLATE BNE Y40HK 2 H NONSTERILE L OLECRANON S STL LO PROF  DEPU[employer] [id number] Left 1 Implanted   SCREW BNE L16MM DIA2.7MM ANK S STL ST VAR ANG LOK FULL THRD - [id number]  SCREW BNE L16MM DIA2.7MM ANK S STL ST VAR ANG LOK FULL THRD  DEPUY SYNTHES [employer]WD [id number] Left 3 Implanted   SCREW BNE L20MM DIA3.5MM CORT S STL ST NONCANNULATED LOK - [id number]  SCREW BNE L20MM DIA3.5MM CORT S STL ST NONCANNULATED LOK  DEPUY SYN[employer]WD [id number] Left 1 Implanted   SCREW BNE L20MM DIA2.7MM ANK S STL ST VAR ANG LOK FULL THRD - [id number]  SCREW BNE L20MM DIA2.7MM ANK S STL ST VAR ANG LOK FULL THRD  DEPUY SYNTHES [employer]WD [id number] Left 1 Implanted   SCREW BNE L26MM DIA2.7MM ANK S STL ST VAR ANG LOK FULL THRD - [id number]  SCREW BNE L26MM DIA2.7MM ANK S STL ST VAR ANG LOK FULL THRD  DEPUY SYN[employer]-WD [id number] Left 2 Implanted   SCREW BNE L30MM DIA2.7MM ANK S STL ST VAR ANG LOK FULL THRD - [id number]  SCREW BNE L30MM DIA2.7MM ANK S STL ST VAR ANG LOK FULL THRD  DEPUY SYN[employer] [place]WD [id number] Left 1 Implanted   SCREW BNE L16MM DIA3.5MM CORT S STL ST LOK FULL THRD - [id number]  SCREW BNE L16MM DIA3.5MM  CORT S STL ST LOK FULL THRD  DEPUY SYNTHES [place]WD [id number] Left 1 Implanted   SCREW BNE L18MM DIA2.7MM ANK S STL ST VAR ANG LOK FULL THRD - [id number]  SCREW BNE L18MM DIA2.7MM ANK S STL ST VAR ANG LOK FULL THRD  DEPUY SYNTHES [place]WD [id number] Left 1 Implanted               Specimens: * No specimens in log *        Drains: None                Complications: None    Counts: Sponge and needle counts were correct times two.    Signed By:  Trudi Ida, MD     June 24, 2021

## 2021-06-24 NOTE — Interval H&P Note (Signed)
Update History & Physical    The Patient's History and Physical of 06/23/2021 was reviewed with the patient and I examined the patient.  There was no change.  The surgical site was confirmed by the patient and me.    Plan:  The risk, benefits, expected outcome, and alternative to the recommended procedure have been discussed with the patient.  Patient understands and wants to proceed with open reduction internal fixation of left distal humerus capitellum fracture.    Electronically signed by Trudi Ida, MD on 06/24/2021 at 8:54 AM

## 2021-06-24 NOTE — Consults (Addendum)
[facility]  [address] [place] [address]  [phone]    Patient Name:  Debbie Gonzalez  Date of Birth:  02/04/1956      Office Visit 06/24/2021    CHIEF COMPLAINT:    No chief complaint on file.      HISTORY OF PRESENT ILLNESS:    Very pleasant 66 year old wf with post procedural PTX.  Patient came in for elective L elbow surgery and had a L brachial block that was complicated by a large L PTX.  We are consulted to consider a chest tube placement.  Patient is hemodynamically stable and a little uncomfortable but not overtly short of breath.  CXR with a large L PTX with mediastinal shift to the R.      Past Medical History:   Diagnosis Date    Anxiety     Closed fracture of capitellum of distal humerus, left, initial encounter          Patient Active Problem List   Diagnosis    Closed fracture of capitellum of distal humerus, left, initial encounter           Past Surgical History:   Procedure Laterality Date    COLONOSCOPY      CYST REMOVAL Left     top of left foot    TUBAL LIGATION      WISDOM TOOTH EXTRACTION           Social History     Socioeconomic History    Marital status: Married     Spouse name: Not on file    Number of children: Not on file    Years of education: Not on file    Highest education level: Not on file   Occupational History    Not on file   Tobacco Use    Smoking status: Never    Smokeless tobacco: Never   Vaping Use    Vaping Use: Never used   Substance and Sexual Activity    Alcohol use: Yes     Alcohol/week: 10.0 standard drinks     Types: 10 Cans of beer per week    Drug use: Never    Sexual activity: Not Currently   Other Topics Concern    Not on file   Social History Narrative    Not on file     Social Determinants of Health     Financial Resource Strain: Not on file   Food Insecurity: Not on file   Transportation Needs: Not on file   Physical Activity: Not on file   Stress: Not on file   Social Connections: Not on file   Intimate Partner  Violence: Not on file   Housing Stability: Not on file         History reviewed. No pertinent family history.      No Known Allergies      Current Facility-Administered Medications   Medication Dose Route Frequency    lactated ringers IV soln infusion   IntraVENous Continuous    fentaNYL (SUBLIMAZE) injection 25 mcg  25 mcg IntraVENous Q5 Min PRN    HYDROmorphone (DILAUDID) injection 0.5 mg  0.5 mg IntraVENous Q10 Min PRN    oxyCODONE (ROXICODONE) immediate release tablet 5 mg  5 mg Oral Once PRN    ondansetron (ZOFRAN) injection 4 mg  4 mg IntraVENous Once PRN    metoclopramide (REGLAN) injection 10 mg  10 mg IntraVENous Once PRN  diphenhydrAMINE (BENADRYL) injection 12.5 mg  12.5 mg IntraVENous Once PRN    scopolamine (TRANSDERM-SCOP) transdermal patch 1 patch  1 patch TransDERmal Q72H           Review of Systems          PHYSICAL EXAM:    Vitals:    06/24/21 1608   BP: (!) 107/58   Pulse: 63   Resp:    Temp:    SpO2: 94%        GENERAL APPEARANCE:   The patient is normal weight and in no respiratory distress.   HEENT:   PERRL.  Conjunctivae unremarkable.   Nasal mucosa is without epistaxis, exudate, or polyps.  Gums and dentition are unremarkable.  There is no oropharyngeal narrowing.  TMs are clear.   NECK/LYMPHATIC:   Symmetrical with no elevation of jugular venous pulsation.  Trachea midline. No thyroid enlargement.  No cervical adenopathy.   LUNGS:   Normal respiratory effort with symmetrical lung expansion  on R.   Breath sounds decreased on L.   HEART:   There is a regular rate and rhythm.  No murmur, rub, or gallop.  There is no edema in the lower extremities.   ABDOMEN:   Soft and non-tender.  No hepatosplenomegaly.  Bowel sounds are normal.     SKIN:   There are no rashes, cyanosis, jaundice, or ecchymosis present.   EXTREMITIES:   The extremities are unremarkable without clubbing, cyanosis, joint inflammation, degenerative, or ischemic change.   MUSCULOSKELETAL:   There is no abnormal tone, muscle  atrophy, or abnormal movement present.   NEURO:   The patient is alert and oriented to person, place, and time.  Memory appears intact and mood is normal.  No gross sensorimotor deficits are present.      DIAGNOSTIC TESTS:       ASSESSMENT:  (Medical Decision Making)       Patient Active Problem List   Diagnosis    Closed fracture of capitellum of distal humerus, left, initial encounter       ICD-10-CM    1. Closed fracture of capitellum of distal humerus, left, initial encounter  S42.452A oxyCODONE-acetaminophen (PERCOCET) 5-325 MG per tablet    Added automatically from request for surgery [id number]      Pneumothorax on the L  An 63F Uresil device was placed on the L without complication with evacuation of 1900cc of air using one way valve without difficulty.  Patient can go home and return for assessment for removal of Uresil device on Monday 06/27/21.  A CXR will be ordered following which the patient will come to the GI lab for assessment and possible removal of Uresil.    Repeat CXR post uresil placement shows almost complete lung reexpansion.          Orders Placed This Encounter   Procedures    NC XR TECHNOLOGIST SERVICE     Standing Status:   Standing     Number of Occurrences:   1     Order Specific Question:   Reason for exam:     Answer:   X RAY DURING CASE    XR ELBOW LEFT (2 VIEWS)     Standing Status:   Standing     Number of Occurrences:   1     Order Specific Question:   Reason for exam:     Answer:   Stop    XR CHEST PORTABLE     Standing Status:  Standing     Number of Occurrences:   1     Order Specific Question:   Reason for exam:     Answer:   dyspnea, chest tightness    Hemoglobin     Standing Status:   Standing     Number of Occurrences:   1    INITIATE PAT ANESTHESIA PROTOCOL     Standing Status:   Standing     Number of Occurrences:   1    Vital signs per unit routine     Standing Status:   Standing     Number of Occurrences:   1    Notify anesthesia provider     Notify anesthesia provider  for pulse less than 50 or greater than 120, respiratory rate less than 8 or greater than 25, temperature greater than 101.3 F (38.5 C) , urinary output less than 30 ml/hr, systolic BP less than 90 or greater than 789, diastolic BP less than 60 or greater than 110, pulse oximetry less than 92%.     Standing Status:   Standing     Number of Occurrences:   1    Phase I - bedrest     Patient must remain on bedrest until motor and sensory function is back to baseline. Must be assisted the first time the patient is ambulated.     Standing Status:   Standing     Number of Occurrences:   1    Phase I - cardiac monitor     Standing Status:   Standing     Number of Occurrences:   [phone]    Phase I & II - check level of sensory block     1) For patients with subarachnoid and/or epidural blocks, document level of sensation and subsequent changes with vital signs. 2) Notify anesthesiologist if no change in level. 3) Document status of regional block on admission and at discharge.     Standing Status:   Standing     Number of Occurrences:   1    Nursing communication - Discharge from PACU     Notify Anesthesia provider prior to discharge when criteria met     Standing Status:   Standing     Number of Occurrences:   1    Encourage deep breathing and coughing     Standing Status:   Standing     Number of Occurrences:   1    Continuous Pulse Oximetry     Document with vital signs and PRN     Standing Status:   Standing     Number of Occurrences:   1    Notify anesthesia provider (specify)     Notify anesthesia provider for blood sugar less than 60 mg/dL (or if patient symptomatic) or if blood sugar is over 200 mg/dL.     Standing Status:   Standing     Number of Occurrences:   1    Full code     Standing Status:   Standing     Number of Occurrences:   1    Initiate PACU Oxygen Therapy Protocol     SpO2 goal: equal to or greater than 92%.     Keep non-intubated patient on anesthesia provider chosen supplemental oxygen delivery device  and setting upon arrival from anesthetizing location.     Once patient has reached an Aldrete Respiratory score of 2, Aldrete Consciousness score level of 1 or greater and vital signs and clinical  condition remain stable, start a gradual wean of the supplemental oxygen by 2 liters/min and/or decrease FiO2 by 10% every 2 min.     Exchange from non-rebreather mask to simple mask when FiO2 requirements are less than 60%, exchange simple mask to Nasal cannula when FiO2 requirements  are less than 40%, and discontinue Nasal cannula supplemental oxygen delivery when SpO2 goal is met with less than 2 liters of Oxygen/min and/or FiO2 requirement is less than 28%.      Encourage patients to take deep breaths and position patient in Fowlers position (45-60 degrees) if possible/applicable.     Standing Status:   Standing     Number of Occurrences:   1    EKG 12 Lead     Standing Status:   Standing     Number of Occurrences:   1     Order Specific Question:   Reason for Exam?     Answer:   Shortness of Breath    TYPE AND SCREEN     Specimen is valid for 3 days - nurse to verify valid specimen     Standing Status:   Standing     Number of Occurrences:   1     Order Specific Question:   Date of Surgery     Answer:   06/24/2021    Discontinue IV     Prior to discharge if patient is going home.     Standing Status:   Standing     Number of Occurrences:   1    Discharge patient     Standing Status:   Standing     Number of Occurrences:   1     Order Specific Question:   Discharge Disposition     Answer:   Home       Orders Placed This Encounter   Medications    lactated ringers IV soln infusion    fentaNYL (SUBLIMAZE) injection 25 mcg    HYDROmorphone (DILAUDID) injection 0.5 mg    oxyCODONE (ROXICODONE) immediate release tablet 5 mg    ondansetron (ZOFRAN) injection 4 mg    metoclopramide (REGLAN) injection 10 mg    diphenhydrAMINE (BENADRYL) injection 12.5 mg    DISCONTD: fentaNYL (SUBLIMAZE) injection 100 mcg    scopolamine  (TRANSDERM-SCOP) transdermal patch 1 patch    DISCONTD: lactated ringers IV soln infusion    DISCONTD: sodium chloride flush 0.9 % injection 5-40 mL    midazolam PF (VERSED) injection 2 mg    ceFAZolin (ANCEF) 2000 mg in sterile water 20 mL IV syringe     Default Settings: Auto-dosed by Weight On Call to O.R x 1 dose  Auto-dosed: Pt Wt<119.9kg-2gm, >120kg-3gm     Order Specific Question:   Antimicrobial Indications     Answer:   Surgical Prophylaxis    oxyCODONE-acetaminophen (PERCOCET) 5-325 MG per tablet     Sig: Take 2 tablets by mouth every 4 hours as needed for Pain for up to 5 days. Intended supply: 5 days. Take lowest dose possible to manage pain Max Daily Amount: 12 tablets     Dispense:  60 tablet     Refill:  0     Reduce doses taken as pain becomes manageable            Georjean Toya Burt Knack, MD    Dictated using voice recognition software. Proofread, but unrecognized voice recognition errors may exist.

## 2021-06-24 NOTE — Addendum Note (Signed)
Addendum  created 06/24/21 1743 by Waymond Cera, MD    Clinical Note Signed

## 2021-06-24 NOTE — Other (Signed)
Discharge instructions reviewed with pt and family member who verbalize understanding of follow up care.

## 2021-06-24 NOTE — H&P (View-Only) (Signed)
[facility]  [address] [place] [address]  [phone]    Patient Name:  Debbie Gonzalez [facility]  Date of Birth:  08/26/1955      Office Visit 06/24/2021    CHIEF COMPLAINT:    No chief complaint on file.      HISTORY OF PRESENT ILLNESS:    Very pleasant 66 year old wf with post procedural PTX.  Patient came in for elective L elbow surgery and had a L brachial block that was complicated by a large L PTX.  We are consulted to consider a chest tube placement.  Patient is hemodynamically stable and a little uncomfortable but not overtly short of breath.  CXR with a large L PTX with mediastinal shift to the R.      Past Medical History:   Diagnosis Date    Anxiety     Closed fracture of capitellum of distal humerus, left, initial encounter          Patient Active Problem List   Diagnosis    Closed fracture of capitellum of distal humerus, left, initial encounter           Past Surgical History:   Procedure Laterality Date    COLONOSCOPY      CYST REMOVAL Left     top of left foot    TUBAL LIGATION      WISDOM TOOTH EXTRACTION           Social History     Socioeconomic History    Marital status: Married     Spouse name: Not on file    Number of children: Not on file    Years of education: Not on file    Highest education level: Not on file   Occupational History    Not on file   Tobacco Use    Smoking status: Never    Smokeless tobacco: Never   Vaping Use    Vaping Use: Never used   Substance and Sexual Activity    Alcohol use: Yes     Alcohol/week: 10.0 standard drinks     Types: 10 Cans of beer per week    Drug use: Never    Sexual activity: Not Currently   Other Topics Concern    Not on file   Social History Narrative    Not on file     Social Determinants of Health     Financial Resource Strain: Not on file   Food Insecurity: Not on file   Transportation Needs: Not on file   Physical Activity: Not on file   Stress: Not on file   Social Connections: Not on file   Intimate Partner  Violence: Not on file   Housing Stability: Not on file         History reviewed. No pertinent family history.      No Known Allergies      Current Facility-Administered Medications   Medication Dose Route Frequency    lactated ringers IV soln infusion   IntraVENous Continuous    fentaNYL (SUBLIMAZE) injection 25 mcg  25 mcg IntraVENous Q5 Min PRN    HYDROmorphone (DILAUDID) injection 0.5 mg  0.5 mg IntraVENous Q10 Min PRN    oxyCODONE (ROXICODONE) immediate release tablet 5 mg  5 mg Oral Once PRN    ondansetron (ZOFRAN) injection 4 mg  4 mg IntraVENous Once PRN    metoclopramide (REGLAN) injection 10 mg  10 mg IntraVENous Once PRN  diphenhydrAMINE (BENADRYL) injection 12.5 mg  12.5 mg IntraVENous Once PRN    scopolamine (TRANSDERM-SCOP) transdermal patch 1 patch  1 patch TransDERmal Q72H           Review of Systems          PHYSICAL EXAM:    Vitals:    06/24/21 1608   BP: (!) 107/58   Pulse: 63   Resp:    Temp:    SpO2: 94%        GENERAL APPEARANCE:   The patient is normal weight and in no respiratory distress.   HEENT:   PERRL.  Conjunctivae unremarkable.   Nasal mucosa is without epistaxis, exudate, or polyps.  Gums and dentition are unremarkable.  There is no oropharyngeal narrowing.  TMs are clear.   NECK/LYMPHATIC:   Symmetrical with no elevation of jugular venous pulsation.  Trachea midline. No thyroid enlargement.  No cervical adenopathy.   LUNGS:   Normal respiratory effort with symmetrical lung expansion  on R.   Breath sounds decreased on L.   HEART:   There is a regular rate and rhythm.  No murmur, rub, or gallop.  There is no edema in the lower extremities.   ABDOMEN:   Soft and non-tender.  No hepatosplenomegaly.  Bowel sounds are normal.     SKIN:   There are no rashes, cyanosis, jaundice, or ecchymosis present.   EXTREMITIES:   The extremities are unremarkable without clubbing, cyanosis, joint inflammation, degenerative, or ischemic change.   MUSCULOSKELETAL:   There is no abnormal tone, muscle  atrophy, or abnormal movement present.   NEURO:   The patient is alert and oriented to person, place, and time.  Memory appears intact and mood is normal.  No gross sensorimotor deficits are present.      DIAGNOSTIC TESTS:       ASSESSMENT:  (Medical Decision Making)       Patient Active Problem List   Diagnosis    Closed fracture of capitellum of distal humerus, left, initial encounter       ICD-10-CM    1. Closed fracture of capitellum of distal humerus, left, initial encounter  S42.452A oxyCODONE-acetaminophen (PERCOCET) 5-325 MG per tablet    Added automatically from request for surgery [id number]      Pneumothorax on the L  An 53F Uresil device was placed on the L without complication with evacuation of 1900cc of air using one way valve without difficulty.  Patient can go home and return for assessment for removal of Uresil device on Monday 06/27/21.  A CXR will be ordered following which the patient will come to the GI lab for assessment and possible removal of Uresil.    Repeat CXR post uresil placement shows almost complete lung reexpansion.          Orders Placed This Encounter   Procedures    NC XR TECHNOLOGIST SERVICE     Standing Status:   Standing     Number of Occurrences:   1     Order Specific Question:   Reason for exam:     Answer:   X RAY DURING CASE    XR ELBOW LEFT (2 VIEWS)     Standing Status:   Standing     Number of Occurrences:   1     Order Specific Question:   Reason for exam:     Answer:   Stop    XR CHEST PORTABLE     Standing Status:  Standing     Number of Occurrences:   1     Order Specific Question:   Reason for exam:     Answer:   dyspnea, chest tightness    Hemoglobin     Standing Status:   Standing     Number of Occurrences:   1    INITIATE PAT ANESTHESIA PROTOCOL     Standing Status:   Standing     Number of Occurrences:   1    Vital signs per unit routine     Standing Status:   Standing     Number of Occurrences:   1    Notify anesthesia provider     Notify anesthesia provider  for pulse less than 50 or greater than 120, respiratory rate less than 8 or greater than 25, temperature greater than 101.3 F (38.5 C) , urinary output less than 30 ml/hr, systolic BP less than 90 or greater than 376, diastolic BP less than 60 or greater than 110, pulse oximetry less than 92%.     Standing Status:   Standing     Number of Occurrences:   1    Phase I - bedrest     Patient must remain on bedrest until motor and sensory function is back to baseline. Must be assisted the first time the patient is ambulated.     Standing Status:   Standing     Number of Occurrences:   1    Phase I - cardiac monitor     Standing Status:   Standing     Number of Occurrences:   [phone]    Phase I & II - check level of sensory block     1) For patients with subarachnoid and/or epidural blocks, document level of sensation and subsequent changes with vital signs. 2) Notify anesthesiologist if no change in level. 3) Document status of regional block on admission and at discharge.     Standing Status:   Standing     Number of Occurrences:   1    Nursing communication - Discharge from PACU     Notify Anesthesia provider prior to discharge when criteria met     Standing Status:   Standing     Number of Occurrences:   1    Encourage deep breathing and coughing     Standing Status:   Standing     Number of Occurrences:   1    Continuous Pulse Oximetry     Document with vital signs and PRN     Standing Status:   Standing     Number of Occurrences:   1    Notify anesthesia provider (specify)     Notify anesthesia provider for blood sugar less than 60 mg/dL (or if patient symptomatic) or if blood sugar is over 200 mg/dL.     Standing Status:   Standing     Number of Occurrences:   1    Full code     Standing Status:   Standing     Number of Occurrences:   1    Initiate PACU Oxygen Therapy Protocol     SpO2 goal: equal to or greater than 92%.     Keep non-intubated patient on anesthesia provider chosen supplemental oxygen delivery device  and setting upon arrival from anesthetizing location.     Once patient has reached an Aldrete Respiratory score of 2, Aldrete Consciousness score level of 1 or greater and vital signs and clinical  condition remain stable, start a gradual wean of the supplemental oxygen by 2 liters/min and/or decrease FiO2 by 10% every 2 min.     Exchange from non-rebreather mask to simple mask when FiO2 requirements are less than 60%, exchange simple mask to Nasal cannula when FiO2 requirements  are less than 40%, and discontinue Nasal cannula supplemental oxygen delivery when SpO2 goal is met with less than 2 liters of Oxygen/min and/or FiO2 requirement is less than 28%.      Encourage patients to take deep breaths and position patient in Fowlers position (45-60 degrees) if possible/applicable.     Standing Status:   Standing     Number of Occurrences:   1    EKG 12 Lead     Standing Status:   Standing     Number of Occurrences:   1     Order Specific Question:   Reason for Exam?     Answer:   Shortness of Breath    TYPE AND SCREEN     Specimen is valid for 3 days - nurse to verify valid specimen     Standing Status:   Standing     Number of Occurrences:   1     Order Specific Question:   Date of Surgery     Answer:   06/24/2021    Discontinue IV     Prior to discharge if patient is going home.     Standing Status:   Standing     Number of Occurrences:   1    Discharge patient     Standing Status:   Standing     Number of Occurrences:   1     Order Specific Question:   Discharge Disposition     Answer:   Home       Orders Placed This Encounter   Medications    lactated ringers IV soln infusion    fentaNYL (SUBLIMAZE) injection 25 mcg    HYDROmorphone (DILAUDID) injection 0.5 mg    oxyCODONE (ROXICODONE) immediate release tablet 5 mg    ondansetron (ZOFRAN) injection 4 mg    metoclopramide (REGLAN) injection 10 mg    diphenhydrAMINE (BENADRYL) injection 12.5 mg    DISCONTD: fentaNYL (SUBLIMAZE) injection 100 mcg    scopolamine  (TRANSDERM-SCOP) transdermal patch 1 patch    DISCONTD: lactated ringers IV soln infusion    DISCONTD: sodium chloride flush 0.9 % injection 5-40 mL    midazolam PF (VERSED) injection 2 mg    ceFAZolin (ANCEF) 2000 mg in sterile water 20 mL IV syringe     Default Settings: Auto-dosed by Weight On Call to O.R x 1 dose  Auto-dosed: Pt Wt<119.9kg-2gm, >120kg-3gm     Order Specific Question:   Antimicrobial Indications     Answer:   Surgical Prophylaxis    oxyCODONE-acetaminophen (PERCOCET) 5-325 MG per tablet     Sig: Take 2 tablets by mouth every 4 hours as needed for Pain for up to 5 days. Intended supply: 5 days. Take lowest dose possible to manage pain Max Daily Amount: 12 tablets     Dispense:  60 tablet     Refill:  0     Reduce doses taken as pain becomes manageable            Shykeria Sakamoto Burt Knack, MD    Dictated using voice recognition software. Proofread, but unrecognized voice recognition errors may exist.

## 2021-06-24 NOTE — Op Note (Signed)
Operative Note      Patient: Debbie Gonzalez  Date of Birth: 1956-01-18  MRN: [id number]    Date of Procedure: 06/24/2021    Pre-Op Diagnosis: PTX on L    Post-Op Diagnosis: Same       Procedure(s):  ORIF LEFT CAPITELLUM FX W/OLECRANON OSTEOTOMY    Surgeon(s):  Kriste Basque, MD    Assistant:   * No surgical staff found *    Anesthesia: Choice    Estimated Blood Loss (mL): Minimal    Complications: None    Specimens:   * No specimens in log *    Implants:  Implant Name Type Inv. Item Serial No. Manufacturer Lot No. LRB No. Used Action   GRAFT BNE SUB 10CC CA PHSPTE INJ DRILLABLE VOID FILL NORIAN - [id number]  GRAFT BNE SUB 10CC CA PHSPTE INJ DRILLABLE VOID FILL NORIAN  DEPUY SYNTHES [place]-WD [id number] Left 1 Implanted   WASHER ORTH DIA13MM FOR CANN SCR - [id number]  WASHER ORTH DIA13MM FOR CANN SCR  DEPUY SYNTHES [employer]WD [id number] Left 1 Implanted   SCREW BNE LNG THRD 2.5X40 MM 16 MM CANN HDLSS TI - [id number]  SCREW BNE LNG THRD 2.5X40 MM 16 MM CANN HDLSS TI  DEPUY SYNTHES [employer]WD [id number] Left 1 Implanted   SCREW BNE 2X15 MM QUIK INSRTN TI NS - [id number]  SCREW BNE 2X15 MM QUIK INSRTN TI NS  DEPUY SYNTHES [employer]-WD [id number] Left 1 Implanted   SCREW BNE 2X18 MM QUIK INSRTN TI NS - [id number]  SCREW BNE 2X18 MM QUIK INSRTN TI NS  DEPUY SYNTHES [employer]WD [id number] Left 1 Implanted   SCREW BNE L24MM DIA3.5MM CORT S STL ST NONCANNULATED LOK - [id number]  SCREW BNE L24MM DIA3.5MM CORT S STL ST NONCANNULATED LOK  DEPUY SYN[employer]WD [id number] Left 1 Implanted   SCREW BNE L18MM DIA3.5MM CORT S STL ST LOK FULL THRD - [id number]  SCREW BNE L18MM DIA3.5MM CORT S STL ST LOK FULL THRD  DEPUY SYN[employer]WD [id number] Left 1 Implanted   PLATE BNE N98XQ 2 H NONSTERILE L OLECRANON S STL LO PROF - [id number]  PLATE BNE K48JE 2 H NONSTERILE L OLECRANON S STL LO PROF  DEPU[employer] [id number] Left 1 Implanted   SCREW BNE L16MM DIA2.7MM ANK S STL ST VAR ANG LOK FULL THRD - [id number]  SCREW BNE L16MM DIA2.7MM ANK S STL ST VAR  ANG LOK FULL THRD  DEPUY SYN[employer]WD [id number] Left 3 Implanted   SCREW BNE L20MM DIA3.5MM CORT S STL ST NONCANNULATED LOK - [id number]  SCREW BNE L20MM DIA3.5MM CORT S STL ST NONCANNULATED LOK  DEPUY [employer]WD [id number] Left 1 Implanted   SCREW BNE L20MM DIA2.7MM ANK S STL ST VAR ANG LOK FULL THRD - [id number]  SCREW BNE L20MM DIA2.7MM ANK S STL ST VAR ANG LOK FULL THRD  DEPUY [employer]WD [id number] Left 1 Implanted   SCREW BNE L26MM DIA2.7MM ANK S STL ST VAR ANG LOK FULL THRD - [id number]  SCREW BNE L26MM DIA2.7MM ANK S STL ST VAR ANG LOK FULL THRD  DEPU[employer]WD [id number] Left 2 Implanted   SCREW BNE L30MM DIA2.7MM ANK S STL ST VAR ANG LOK FULL THRD - [id number]  SCREW BNE L30MM DIA2.7MM ANK S STL ST VAR ANG LOK FULL THRD  DEPUY [employer] [place]WD [id number] Left 1 Implanted   SCREW BNE L16MM DIA3.5MM CORT S STL ST LOK FULL THRD - [id number]  SCREW BNE L16MM DIA3.5MM CORT S STL ST LOK FULL THRD  DEPUY [employer] [place] [id number] Left 1 Implanted   SCREW BNE L18MM DIA2.7MM ANK S STL ST VAR ANG LOK FULL THRD - [id number]  SCREW BNE L18MM DIA2.7MM ANK S STL ST VAR ANG LOK FULL THRD  DEPUY SYNTHES [place]WD [id number] Left 1 Implanted         Drains: * No LDAs found *      Detailed Description of Procedure:   PROCEDURE:     46F Ure-Sil device placement for pneumothorax.    INDICATION:    LEFT PNEUMOTHORAX    DESCRIPTION:     After obtaining informed consent from the patient, the left chest was prepped with chlorhexidine in the usual fashion.  The third intercostal space was then localized and 1% lidocaine was injected over the underlying rib (5 cc total) anesthetizing the subcutaneous tissue skin rib and intercostal muscles.  The depth of the pleural space was then noted by aspirating air into the syringe.  Following this the provided scalpel within the kit was used to make a small 3-mm incision in the needle insertion area.  After assuring adequate hemostasis the  11 F. Ure-Sil device was introduced into  the chest using the provided trocar in the kit in the usual fashion.  The adhesive type protector was then removed and the hole device affixed to the patient's chest the valve within the device was seen moving up and down with respiration.    A One-way valve provided in the kit was then attached and air aspirated with a 60 cc syringe removing a total off 1900 cc of air.  The suction port was then closed with the provided cap.    There were no complications, the patient tolerated the procedure very well and was discharged home after a chest x-ray was performed.      Lung is re expanded with residual apical PTX    RECOMMENDATION:   Return Monday 06/27/21  for AP chest x-ray if no pneumothorax present we will removed the Ure-Sil.  Note sent to our office    Rogue Bussing MD.         Electronically signed by Rogue Bussing, MD on 06/24/2021 at 5:43 PM

## 2021-06-24 NOTE — Anesthesia Procedure Notes (Signed)
Peripheral Block    Patient location during procedure: pre-op  Reason for block: post-op pain management and at surgeon's request  Start time: 06/24/2021 9:27 AM  End time: 06/24/2021 9:29 AM  Staffing  Anesthesiologist: Waymond Cera, MD  Preanesthetic Checklist  Completed: patient identified, IV checked, site marked, risks and benefits discussed, surgical/procedural consents, equipment checked, pre-op evaluation, timeout performed, anesthesia consent given, oxygen available and monitors applied/VS acknowledged  Peripheral Block   Patient position: prone  Prep: ChloraPrep  Provider prep: mask and sterile gloves  Patient monitoring: cardiac monitor, continuous pulse ox, frequent blood pressure checks, IV access, oxygen and responsive to questions  Block type: Brachial plexus  Supraclavicular  Laterality: left  Injection technique: single-shot  Guidance: ultrasound guided    Needle   Needle type: insulated echogenic nerve stimulator needle   Needle gauge: 22 G  Needle localization: anatomical landmarks and ultrasound guidance  Test dose: negative  Needle length: 4.  Assessment   Injection assessment: negative aspiration for heme, no paresthesia on injection and local visualized surrounding nerve on ultrasound  Slow fractionated injection: yes  Hemodynamics: stable  Real-time [place] image taken/store: yes  Outcomes: uncomplicated    Medications Administered  bupivacaine-EPINEPHrine PF 0.5% -1:200000 - Perineural   30 mL - 06/24/2021 9:29:00 AM

## 2021-06-24 NOTE — Progress Notes (Signed)
S/P elbow surgery under TIVA with supraclavicular block. Good block for surgery. In PACU c/o chest tightness eith deep breaths.  Oxygen saturation 98%. Auscultation reveals decreased breath sounds on left(surgical side)  CXR show pneumothorax.  [facility] for treatment of pneumothorax

## 2021-06-24 NOTE — Anesthesia Post-Procedure Evaluation (Signed)
Department of Anesthesiology  Postprocedure Note    Patient: Debbie Gonzalez  MRN: [id number]  Birthdate: [birth year]  Date of evaluation: 06/24/2021      Procedure Summary     Date: 06/24/21 Room / Location: [facility] MAIN OR 07 / [facility] MAIN OR    Anesthesia Start: 1141 Anesthesia Stop: 1545    Procedure: ORIF LEFT CAPITELLUM FX W/OLECRANON OSTEOTOMY (Left: Elbow) Diagnosis:       Closed fracture of capitellum of distal humerus, left, initial encounter      (Closed fracture of capitellum of distal humerus, left, initial encounter [M09.470J])    Providers: Trudi Ida, MD Responsible Provider: Waymond Cera, MD    Anesthesia Type: General ASA Status: 2          Anesthesia Type: General    Aldrete Phase I: Aldrete Score: 9    Aldrete Phase II:        Anesthesia Post Evaluation    Patient location during evaluation: PACU  Patient participation: complete - patient participated  Level of consciousness: awake and awake and alert  Airway patency: patent  Nausea & Vomiting: no nausea  Complications: no  Cardiovascular status: hemodynamically stable  Respiratory status: acceptable  Hydration status: euvolemic  Comments: Post block pneumotharx as noted  Multimodal analgesia pain management approach

## 2021-06-24 NOTE — Interval H&P Note (Signed)
Update History & Physical    The patient's History and Physical of June 24, 2021 was reviewed with the patient and I examined the patient. There was no change. The surgical site was confirmed by the patient and me.     Plan: The risks, benefits, expected outcome, and alternative to the recommended procedure have been discussed with the patient. Patient understands and wants to proceed with the procedure.     Electronically signed by Earley Brooke, MD on 06/24/2021 at 5:42 PM

## 2021-06-24 NOTE — Addendum Note (Signed)
Addendum  created 06/24/21 1741 by Waymond Cera, MD    Clinical Note Signed

## 2021-06-25 LAB — EKG 12-LEAD
Atrial Rate: 72 {beats}/min
Diagnosis: NORMAL
P Axis: 69 degrees
P-R Interval: 146 ms
Q-T Interval: 360 ms
QRS Duration: 80 ms
QTc Calculation (Bazett): 394 ms
R Axis: 66 degrees
T Axis: 74 degrees
Ventricular Rate: 72 {beats}/min

## 2021-06-27 ENCOUNTER — Inpatient Hospital Stay: Payer: MEDICARE

## 2021-06-27 ENCOUNTER — Ambulatory Visit: Admit: 2021-06-27 | Payer: MEDICARE | Primary: Diagnostic Radiology

## 2021-06-27 NOTE — Discharge Instructions (Signed)
Please arrive to [address] At 8:00 am Thursday 06/30/21 and report to main admitting office. After being admitted come to the second floor endoscopy lab for your procedure.

## 2021-06-27 NOTE — Procedures (Deleted)
PROCEDURE:  DIAGNOSTIC ULTRASOUND OF CHEST    DIAGNOSIS:  Left Pneumothorax    CHEST ULTRASOUND FINDINGS:    A Vscan GE ultrasound was used to image the chest and localize the pleural effusion on the Left  chest.    Uresil in placed and planned for removal, but there is still movement consistent with air leak with deep breathing and cough.     Ultrasound shows normal lung sliding laterally and in lower left, but apically at the clavicle there is not.     Will get portable CXR and if no or very small PTX can go home and come back on Thursday for re-evaluation. If pneumothorax is worse she may have to be admitted for suction to chest tube.     IMAGE: scanned to Media    Floreen Comber, MD

## 2021-06-27 NOTE — H&P (Signed)
HISTORY AND PHYSICAL  Debbie Gonzalez  06/27/2021   Date of Admission:  06/27/2021    The patient's chart is reviewed and the patient is discussed with the staff.    Subjective:     Patient is a 66 y.o. female presents with post procedural PTX. She came in for elective L elbow surgery and had a L brachial block that was complicated by a large L PTX. Dr. Yvetta Coder placed a Uresil in left chest 2/10 evening with improvement and was able to be discharged. She has felt okay at home, still sore with left arm. Some shortness of breath at times, but not worsening.    Review of Systems:  Comprehensive ROS negative except in HPI    Current Outpatient Medications   Medication Instructions    Calcium-Vitamin D-Vitamin K (VIACTIV PO) Oral, EVERY BEDTIME    docusate sodium (COLACE) 100 mg, Oral, AS NEEDED    ferrous sulfate (IRON 325) 325 mg, Oral, EVERY BEDTIME    Multiple Vitamin (MULTIVITAMIN ADULT PO) Oral, EVERY BEDTIME    Multiple Vitamins-Minerals (PRESERVISION AREDS 2+MULTI VIT PO) 1 tablet, Oral, 2 times daily    oxyCODONE-acetaminophen (PERCOCET) 5-325 MG per tablet 2 tablets, Oral, EVERY 4 HOURS PRN, Intended supply: 5 days. Take lowest dose possible to manage pain    sertraline (ZOLOFT) 100 mg, Oral, DAILY    VITAMIN D PO Oral, EVERY BEDTIME    VITAMIN E PO Oral, EVERY BEDTIME      Past Medical History:   Diagnosis Date    Anxiety     Closed fracture of capitellum of distal humerus, left, initial encounter      Past Surgical History:   Procedure Laterality Date    COLONOSCOPY      CYST REMOVAL Left     top of left foot    FRACTURE SURGERY Left 06/24/2021    TUBAL LIGATION      WISDOM TOOTH EXTRACTION       Social History     Socioeconomic History    Marital status: Married     Spouse name: Not on file    Number of children: Not on file    Years of education: Not on file    Highest education level: Not on file   Occupational History    Not on file    Tobacco Use    Smoking status: Never    Smokeless tobacco: Never   Vaping Use    Vaping Use: Never used   Substance and Sexual Activity    Alcohol use: Yes     Alcohol/week: 10.0 standard drinks     Types: 10 Cans of beer per week    Drug use: Never    Sexual activity: Not Currently   Other Topics Concern    Not on file   Social History Narrative    Not on file     Social Determinants of Health     Financial Resource Strain: Not on file   Food Insecurity: Not on file   Transportation Needs: Not on file   Physical Activity: Not on file   Stress: Not on file   Social Connections: Not on file   Intimate Partner Violence: Not on file   Housing Stability: Not on file     Family History   Problem Relation Age of Onset    Cancer Father     Heart Disease Father      No Known Allergies  Objective:   There were no vitals  filed for this visit.  PHYSICAL EXAM   Constitutional:  the patient is well developed and in no acute distress  EENMT:  Sclera clear, pupils equal, oral mucosa moist  Respiratory: symmetric chest rise. Clear bilaterally, L Uresil, + diaphragm movement with cough and deep breathing  Cardiovascular:  RRR without M,G,R. There is L arm swelling and bruising post op  Gastrointestinal: soft and non-tender; with positive bowel sounds.  Musculoskeletal: warm without cyanosis. Normal muscle tone.   Skin:  no jaundice or rashes, L arm sling, bruising   Neurologic: symmetric strength, fluent speech  Psychiatric:  calm, appropriate, oriented x 4    Imaging: I performed an independent interpretation of the patient's images.  CXR:     No results found for this or any previous visit.     No results found for this or any previous visit.    LAB:  No results for input(s): WBC, HGB, HCT, PLT, INR in the last 72 hours.  No results for input(s): NA, K, CL, CO2, GLU, BUN, CREATININE, MG, CA, PHOS, ALB, BILITOT, AST, ALT, ALKPHOS in the last 72 hours.  No results for input(s): LACTATE, PROCAL, TROPHS, NTPROBNP, CRP, ESR in the  last 72 hours.  No results for input(s): PHAPOC, PCO2APOC, PO2APOC, HCO3MIX, BE in the last 72 hours.  Microbiology:   No results for input(s): CULTURE in the last 72 hours.  Assessment and Plan:  (Medical Decision Making)   Impression: 66 y/o female with Pneumothorax after brachial nerve block and ORIF.    Active Problems:    Pneumothorax on left  Plan:Uresil in placed and planned for removal, but there is still movement consistent with air leak with deep breathing and cough.      Ultrasound shows normal lung sliding laterally and in lower left, but apically at the clavicle there is not.      CXR looks very good with no visible PTX. She can go home and come back on Thursday for re-evaluation. ER precautions given to her and her husband.     Closed fracture of capitellum of distal humerus, left, initial encounter  Plan: ongoing swelling, bruising, pending follow up with orthopedics      Pulmonary nodule  Plan: needs outpatient CT and follow up in the clinic in a few weeks after all this is resolved. Will send a message to the clinic to schedule.     More than 50% of the time documented was spent in face-to-face contact with the patient and in the care of the patient on the floor/unit where the patient is located.    Harrel Lemon, MD    Dictated using voice recognition software.  Proof read but unrecognized errors may exist.

## 2021-06-27 NOTE — H&P (View-Only) (Signed)
HISTORY AND PHYSICAL  Debbie Gonzalez  06/27/2021   Date of Admission:  06/27/2021    The patient's chart is reviewed and the patient is discussed with the staff.    Subjective:     Patient is a 66 y.o. female presents with post procedural PTX. She came in for elective L elbow surgery and had a L brachial block that was complicated by a large L PTX. Dr. Yvetta Coder placed a Uresil in left chest 2/10 evening with improvement and was able to be discharged. She has felt okay at home, still sore with left arm. Some shortness of breath at times, but not worsening.    Review of Systems:  Comprehensive ROS negative except in HPI    Current Outpatient Medications   Medication Instructions    Calcium-Vitamin D-Vitamin K (VIACTIV PO) Oral, EVERY BEDTIME    docusate sodium (COLACE) 100 mg, Oral, AS NEEDED    ferrous sulfate (IRON 325) 325 mg, Oral, EVERY BEDTIME    Multiple Vitamin (MULTIVITAMIN ADULT PO) Oral, EVERY BEDTIME    Multiple Vitamins-Minerals (PRESERVISION AREDS 2+MULTI VIT PO) 1 tablet, Oral, 2 times daily    oxyCODONE-acetaminophen (PERCOCET) 5-325 MG per tablet 2 tablets, Oral, EVERY 4 HOURS PRN, Intended supply: 5 days. Take lowest dose possible to manage pain    sertraline (ZOLOFT) 100 mg, Oral, DAILY    VITAMIN D PO Oral, EVERY BEDTIME    VITAMIN E PO Oral, EVERY BEDTIME      Past Medical History:   Diagnosis Date    Anxiety     Closed fracture of capitellum of distal humerus, left, initial encounter      Past Surgical History:   Procedure Laterality Date    COLONOSCOPY      CYST REMOVAL Left     top of left foot    FRACTURE SURGERY Left 06/24/2021    TUBAL LIGATION      WISDOM TOOTH EXTRACTION       Social History     Socioeconomic History    Marital status: Married     Spouse name: Not on file    Number of children: Not on file    Years of education: Not on file    Highest education level: Not on file   Occupational History    Not on file    Tobacco Use    Smoking status: Never    Smokeless tobacco: Never   Vaping Use    Vaping Use: Never used   Substance and Sexual Activity    Alcohol use: Yes     Alcohol/week: 10.0 standard drinks     Types: 10 Cans of beer per week    Drug use: Never    Sexual activity: Not Currently   Other Topics Concern    Not on file   Social History Narrative    Not on file     Social Determinants of Health     Financial Resource Strain: Not on file   Food Insecurity: Not on file   Transportation Needs: Not on file   Physical Activity: Not on file   Stress: Not on file   Social Connections: Not on file   Intimate Partner Violence: Not on file   Housing Stability: Not on file     Family History   Problem Relation Age of Onset    Cancer Father     Heart Disease Father      No Known Allergies  Objective:   There were no vitals  filed for this visit.  PHYSICAL EXAM   Constitutional:  the patient is well developed and in no acute distress  EENMT:  Sclera clear, pupils equal, oral mucosa moist  Respiratory: symmetric chest rise. Clear bilaterally, L Uresil, + diaphragm movement with cough and deep breathing  Cardiovascular:  RRR without M,G,R. There is L arm swelling and bruising post op  Gastrointestinal: soft and non-tender; with positive bowel sounds.  Musculoskeletal: warm without cyanosis. Normal muscle tone.   Skin:  no jaundice or rashes, L arm sling, bruising   Neurologic: symmetric strength, fluent speech  Psychiatric:  calm, appropriate, oriented x 4    Imaging: I performed an independent interpretation of the patient's images.  CXR:     No results found for this or any previous visit.     No results found for this or any previous visit.    LAB:  No results for input(s): WBC, HGB, HCT, PLT, INR in the last 72 hours.  No results for input(s): NA, K, CL, CO2, GLU, BUN, CREATININE, MG, CA, PHOS, ALB, BILITOT, AST, ALT, ALKPHOS in the last 72 hours.  No results for input(s): LACTATE, PROCAL, TROPHS, NTPROBNP, CRP, ESR in the  last 72 hours.  No results for input(s): PHAPOC, PCO2APOC, PO2APOC, HCO3MIX, BE in the last 72 hours.  Microbiology:   No results for input(s): CULTURE in the last 72 hours.  Assessment and Plan:  (Medical Decision Making)   Impression: 66 y/o female with Pneumothorax after brachial nerve block and ORIF.    Active Problems:    Pneumothorax on left  Plan:Uresil in placed and planned for removal, but there is still movement consistent with air leak with deep breathing and cough.      Ultrasound shows normal lung sliding laterally and in lower left, but apically at the clavicle there is not.      CXR looks very good with no visible PTX. She can go home and come back on Thursday for re-evaluation. ER precautions given to her and her husband.     Closed fracture of capitellum of distal humerus, left, initial encounter  Plan: ongoing swelling, bruising, pending follow up with orthopedics      Pulmonary nodule  Plan: needs outpatient CT and follow up in the clinic in a few weeks after all this is resolved. Will send a message to the clinic to schedule.     More than 50% of the time documented was spent in face-to-face contact with the patient and in the care of the patient on the floor/unit where the patient is located.    Harrel Lemon, MD    Dictated using voice recognition software.  Proof read but unrecognized errors may exist.

## 2021-06-28 ENCOUNTER — Encounter: Payer: MEDICARE | Attending: Orthopaedic Surgery | Primary: Diagnostic Radiology

## 2021-06-28 ENCOUNTER — Telehealth

## 2021-06-28 NOTE — Telephone Encounter (Addendum)
-----   Message from Floreen Comber, MD sent at 06/27/2021 10:58 AM EST -----  Needs a follow up set up in 3 weeks. Needs a CT scan prior. Don't want her to get immediately as she needs PTX resolved and Uresil removed.     Sine    Noted patient is scheduled for Uresil removal on 2/16.  Will contact patience once procedure has occurred to assure Varney Baas is removed.

## 2021-06-29 NOTE — Progress Notes (Signed)
Procedure confirmed with patient. Aware of 0800 arrival time, where to arrive and driver policy. Covid screening completed. No questions at this time.

## 2021-06-30 ENCOUNTER — Encounter: Payer: MEDICARE | Attending: Orthopaedic Surgery | Primary: Diagnostic Radiology

## 2021-06-30 ENCOUNTER — Ambulatory Visit: Admit: 2021-06-30 | Discharge: 2021-06-30 | Payer: MEDICARE | Primary: Internal Medicine

## 2021-06-30 ENCOUNTER — Ambulatory Visit: Admit: 2021-06-30 | Payer: MEDICARE | Primary: Internal Medicine

## 2021-06-30 ENCOUNTER — Ambulatory Visit
Admit: 2021-06-30 | Discharge: 2021-06-30 | Payer: MEDICARE | Attending: Orthopaedic Surgery | Primary: Internal Medicine

## 2021-06-30 DIAGNOSIS — S42452A Displaced fracture of lateral condyle of left humerus, initial encounter for closed fracture: Secondary | ICD-10-CM

## 2021-06-30 DIAGNOSIS — S53195A Other dislocation of left ulnohumeral joint, initial encounter: Secondary | ICD-10-CM

## 2021-06-30 NOTE — H&P (Signed)
History and physical    Patient: Debbie Gonzalez MRN: [id number]  SSN: [id number]    Date of Birth: 01/04/1956  Age: 66 y.o.  Sex: female      Subjective:      [name] is a 66 y.o. female who is here today in follow-up.  She underwent open reduction internal fixation of the distal humerus fracture just 1 week ago.  This was a distal humerus fracture and involve a significant portion of the capitellum and the lateral condyle area.  It was done through a olecranon osteotomy.  A couple of things that transpired during the case was the fact that this was a very comminuted fracture and she had a lot of comminution in the area where the lateral ligaments attach to the distal humerus so this was sort of pulled off as a sleeve really for the lateral ligaments attached instead of being the ligaments off of the humerus they were actually pulled off with some bone.  She did have a pretty good bit of comminution and the articular surface of the distal humerus I treated this operatively with multiple countersunk lag screws.  I also repaired her lateral ligaments.  Her olecranon osteotomy when it came time to repair this with a cannulated screw and with my reduction forceps the proximal fragment of the olecranon osteotomy just basically broken about 4 pieces with my reduction forceps and as a result I had to abandon the fixation of the Licon osteotomy with 7.3 cannulated screw fixation instead fix this with an olecranon plate.  After this she seemed like she was doing well her postoperative films were very appropriate and was pleased with the way these look but I had been concerned about the fact that she did seem to have some instability the ulnohumeral joint even after fixation of her lateral ligaments so I was a little bit concerned but once I had her flex to 90 in a splint she seems stable and her postoperative films seems stable.  However I did tell her and her husband I thought was very important to follow  this up early and get x-rays about a week out just to make sure that her ulnohumeral joint was still reduced because I was concerned about the stability of this.  She has had a lot of pain and swelling.  The other thing she has had as this has been complicated by the fact that she had a pneumothorax after her regional block for her surgical intervention and so she has had to deal with this as well and has been to seen pulmonary on Monday and today of this week.  So she has had quite a bit of issues unfortunately.  Still having a lot of pain but the pain medicine is helping and she just is little bit concerned about all the swelling she is having as well.    Past Medical History:   Diagnosis Date    Anxiety     Closed fracture of capitellum of distal humerus, left, initial encounter 06/24/2021    Dr. Zoe Lan    Complication of neuromuscular block     patient states "chest tube placed after left elbow surgery because of punctured lung from block they gave me"    Hx of chest tube placement     chest tube removed 06/30/2021    Illness, unspecified     patient had scopalamine patch placed prior to ORIF of left elbow but does not have hx of N&V w/  anesthesia     Past Surgical History:   Procedure Laterality Date    COLONOSCOPY      CYST REMOVAL Left     top of left foot    FRACTURE SURGERY Left 06/24/2021    HUMERUS FRACTURE SURGERY Left 06/24/2021    ORIF LEFT CAPITELLUM FX W/OLECRANON OSTEOTOMY performed by Trudi Ida, MD at [facility] MAIN OR    TUBAL LIGATION      WISDOM TOOTH EXTRACTION        FAMHX -No history of inflammatory arthritis   Social History     Tobacco Use    Smoking status: Never    Smokeless tobacco: Never   Substance Use Topics    Alcohol use: Yes     Alcohol/week: 14.0 standard drinks     Types: 14 Cans of beer per week      Current Outpatient Medications   Medication Sig Dispense Refill    ibuprofen (ADVIL;MOTRIN) 200 MG tablet Take 600 mg by mouth every 6 hours as needed for Pain      estradiol  (ESTRACE) 0.1 MG/GM vaginal cream Place 1 g vaginally      ALPRAZolam (XANAX) 1 MG tablet Take 1 mg by mouth nightly as needed.      oxyCODONE-acetaminophen (PERCOCET) 5-325 MG per tablet Take 1 tablet by mouth every 4 hours.      Multiple Vitamins-Minerals (PRESERVISION AREDS 2+MULTI VIT PO) Take 1 tablet by mouth in the morning and at bedtime      docusate sodium (COLACE) 100 MG capsule Take 100 mg by mouth as needed for Constipation      sertraline (ZOLOFT) 100 MG tablet Take 100 mg by mouth daily      Multiple Vitamin (MULTIVITAMIN ADULT PO) Take by mouth nightly      VITAMIN D PO Take by mouth nightly      VITAMIN E PO Take by mouth nightly      ferrous sulfate (IRON 325) 325 (65 Fe) MG tablet Take 325 mg by mouth nightly      Calcium-Vitamin D-Vitamin K (VIACTIV PO) Take by mouth nightly       No current facility-administered medications for this visit.        No Known Allergies    Review of Systems:  A comprehensive review of systems was negative.    Objective:     There were no vitals filed for this visit.     Physical Exam:  Physical Exam:  General:  Alert, cooperative, no distress, appears stated age. Orientation is alert and oriented person place time and situation   Eyes:  Conjunctivae/corneas clear. PERRL, EOMs intact. Fundi benign   Ears:  Normal TMs and external ear canals both ears.   Nose: Nares normal. Septum midline. Mucosa normal. No drainage or sinus tenderness.   Mouth/Throat: Lips, mucosa, and tongue normal. Teeth and gums normal.   Neck: Supple, symmetrical, trachea midline, no adenopathy, thyroid: no enlargment/tenderness/nodules, no carotid bruit and no JVD.   Back:   Symmetric, no curvature. ROM normal. No CVA tenderness.   Lungs:   Clear to auscultation bilaterally.   Heart:  Regular rate and rhythm, S1, S2 normal, no murmur, click, rub or gallop.   Abdomen:   Soft, non-tender. Bowel sounds normal. No masses,  No organomegaly.       No lymphadenopathy in all 4 extremities  Alignment-she  has near normal alignment of her left elbow clinically with only minimal deformity clinically  Range of motion-she has  pain with any range of motion of her left elbow  Vascular-distal pulses palpable in left upper extremity  Sensory/motor-deep tendon reflexes normal left upper extremity.  Motor and sensory function intact.  Stability-I have not tested the stability because I have noted that she is subluxated on x-ray  Tenderness to palpation throughout the left elbow area  Skin-her actual skin incision appears to be doing well there is no redness no purulent drainage  Gait-she has a normal gait    Assessment:     @HPROB @  Left distal humerus fracture with subluxation of ulnohumeral joint    Xrays and or studies:    Indications-follow-up left elbow fracture dislocation, findings-AP and lateral views of the left elbow shows that the ulnohumeral joint so essentially the elbow articulation has subluxed and is definitely different than her immediate postoperative films.  It does appear that the reconstruction of the distal humerus as well as the olecranon with this plate and screw construct are all intact it does appear that this is more a issue with the actual ulnohumeral and elbow joint versus the bones themselves.  Impression-left elbow distal humerus fracture now with subluxation of the ulnohumeral joint  Plan:     So I have spoken with the patient at length regarding the fact that obviously this is the reason that I thought was very important to follow her up closely as I was concerned that this could be a pretty significantly unstable left elbow joint.  And as result of her postoperative films today it does appear that this has slipped and is definitely different than her immediate postoperative films.  As result I think the most reasonable thing is to return to the operating room and I have explained that the primary plan will be to see if we can just get this to be reduced and completely concentrically reduced  without making a more formal incision.  If we are able to do this and then we would likely just try to place the patient in a hinged external fixator and make sure that the ulnohumeral joint stays reduced.  If I am unable to get a adequate closed reduction of the ulnohumeral and radiocapitellar joints with just closed means and I may have to go back to where her incision and perform a more formal open reduction and perhaps even some revision fixation of either her distal humerus proximal olecranon or both.  They seem to understand this.  So the plan is to return to the operating room tomorrow and we will have the [employer] hinged elbow external fixator available as well as the elbow plates that in case we need to revise any of the fixation.  So we will proceed with revision open treatment of left elbow fracture dislocation tomorrow in the operating room with possible application of external fixator    Signed By: Trudi Ida, MD     June 30, 2021

## 2021-06-30 NOTE — Other (Addendum)
Patient verified name and DOB.  Order for consent NOT found in EHR; patient verifies procedure from case posting.   Type 2 surgery, phone assessment complete.  Orders NOT received.  Labs per surgeon: Unknown  Labs per anesthesia protocol: patient had Hgb on 06/24/2021, Result: 12.4; per anesthesia protocol, no new labs required.     Patient answered medical/surgical history questions at their best of ability. All prior to admission medications documented in [facility]. If you have not heard from MAIN Pre Op by 5 pm, please call [phone].     Patient instructed to take the following medications the day of surgery according to anesthesia guidelines with a small sip of water: sertraline, and xanax if needed DOS.      Hold all vitamins 7 days prior to surgery and NSAIDS 5 days prior to surgery.     Prescription meds to hold: NONE    Patient instructed on the following:    > Arrive at MAIN Entrance, time of arrival to be called the day before by 1700  > NPO after midnight, unless otherwise indicated, including gum, mints, and ice chips  > Responsible adult must drive patient to the hospital, stay during surgery, and patient will need supervision 24 hours after anesthesia  > Use antibacterial soap in shower the night before surgery and on the morning of surgery; Patient states she has not been able to shower in over a week. Instructed patient to do the best she could with sponge bath night before and day of surgery. Advised on bathing cloths available at pharmacies local to patient.   > All piercings must be removed prior to arrival.    > Leave all valuables (money and jewelry) at home but bring insurance card and ID on DOS.   > You may be required to pay a deductible or co-pay on the day of your procedure. You can pre-pay by calling [phone] if your surgery is at the [facility] or [phone] if your surgery is at the [facility].  > Do not wear make-up, nail polish, lotions, cologne, perfumes, powders, or oil  on skin. Artificial nails are not permitted. No Deodorant day of surgery. Patient verbalizes understanding.

## 2021-06-30 NOTE — Progress Notes (Signed)
a         History and physical    Patient: Debbie Gonzalez MRN: [id number]  SSN: [id number]    Date of Birth: May 17, 1955  Age: 66 y.o.  Sex: female      Subjective:      Debbie Gonzalez is a 66 y.o. female who is here today in follow-up.  She underwent open reduction internal fixation of the distal humerus fracture just 1 week ago.  This was a distal humerus fracture and involve a significant portion of the capitellum and the lateral condyle area.  It was done through a olecranon osteotomy.  A couple of things that transpired during the case was the fact that this was a very comminuted fracture and she had a lot of comminution in the area where the lateral ligaments attach to the distal humerus so this was sort of pulled off as a sleeve really for the lateral ligaments attached instead of being the ligaments off of the humerus they were actually pulled off with some bone.  She did have a pretty good bit of comminution and the articular surface of the distal humerus I treated this operatively with multiple countersunk lag screws.  I also repaired her lateral ligaments.  Her olecranon osteotomy when it came time to repair this with a cannulated screw and with my reduction forceps the proximal fragment of the olecranon osteotomy just basically broken about 4 pieces with my reduction forceps and as a result I had to abandon the fixation of the Licon osteotomy with 7.3 cannulated screw fixation instead fix this with an olecranon plate.  After this she seemed like she was doing well her postoperative films were very appropriate and was pleased with the way these look but I had been concerned about the fact that she did seem to have some instability the ulnohumeral joint even after fixation of her lateral ligaments so I was a little bit concerned but once I had her flex to 90 in a splint she seems stable and her postoperative films seems stable.  However I did tell her and her husband I thought was very important to  follow this up early and get x-rays about a week out just to make sure that her ulnohumeral joint was still reduced because I was concerned about the stability of this.  She has had a lot of pain and swelling.  The other thing she has had as this has been complicated by the fact that she had a pneumothorax after her regional block for her surgical intervention and so she has had to deal with this as well and has been to seen pulmonary on Monday and today of this week.  So she has had quite a bit of issues unfortunately.  Still having a lot of pain but the pain medicine is helping and she just is little bit concerned about all the swelling she is having as well.    Past Medical History:   Diagnosis Date    Anxiety     Closed fracture of capitellum of distal humerus, left, initial encounter 06/24/2021    Dr. Zoe Lan    Complication of neuromuscular block     patient states "chest tube placed after left elbow surgery because of punctured lung from block they gave me"    Hx of chest tube placement     chest tube removed 06/30/2021    Illness, unspecified     patient had scopalamine patch placed prior to ORIF of left  elbow but does not have hx of N&V w/ anesthesia     Past Surgical History:   Procedure Laterality Date    COLONOSCOPY      CYST REMOVAL Left     top of left foot    FRACTURE SURGERY Left 06/24/2021    HUMERUS FRACTURE SURGERY Left 06/24/2021    ORIF LEFT CAPITELLUM FX W/OLECRANON OSTEOTOMY performed by Trudi Ida, MD at [facility] MAIN OR    TUBAL LIGATION      WISDOM TOOTH EXTRACTION        FAMHX -No history of inflammatory arthritis   Social History     Tobacco Use    Smoking status: Never    Smokeless tobacco: Never   Substance Use Topics    Alcohol use: Yes     Alcohol/week: 14.0 standard drinks     Types: 14 Cans of beer per week      Current Outpatient Medications   Medication Sig Dispense Refill    ibuprofen (ADVIL;MOTRIN) 200 MG tablet Take 600 mg by mouth every 6 hours as needed for Pain      estradiol  (ESTRACE) 0.1 MG/GM vaginal cream Place 1 g vaginally      ALPRAZolam (XANAX) 1 MG tablet Take 1 mg by mouth nightly as needed.      oxyCODONE-acetaminophen (PERCOCET) 5-325 MG per tablet Take 1 tablet by mouth every 4 hours.      Multiple Vitamins-Minerals (PRESERVISION AREDS 2+MULTI VIT PO) Take 1 tablet by mouth in the morning and at bedtime      docusate sodium (COLACE) 100 MG capsule Take 100 mg by mouth as needed for Constipation      sertraline (ZOLOFT) 100 MG tablet Take 100 mg by mouth daily      Multiple Vitamin (MULTIVITAMIN ADULT PO) Take by mouth nightly      VITAMIN D PO Take by mouth nightly      VITAMIN E PO Take by mouth nightly      ferrous sulfate (IRON 325) 325 (65 Fe) MG tablet Take 325 mg by mouth nightly      Calcium-Vitamin D-Vitamin K (VIACTIV PO) Take by mouth nightly       No current facility-administered medications for this visit.        No Known Allergies    Review of Systems:  A comprehensive review of systems was negative.    Objective:     There were no vitals filed for this visit.     Physical Exam:  Physical Exam:  General:  Alert, cooperative, no distress, appears stated age. Orientation is alert and oriented person place time and situation   Eyes:  Conjunctivae/corneas clear. PERRL, EOMs intact. Fundi benign   Ears:  Normal TMs and external ear canals both ears.   Nose: Nares normal. Septum midline. Mucosa normal. No drainage or sinus tenderness.   Mouth/Throat: Lips, mucosa, and tongue normal. Teeth and gums normal.   Neck: Supple, symmetrical, trachea midline, no adenopathy, thyroid: no enlargment/tenderness/nodules, no carotid bruit and no JVD.   Back:   Symmetric, no curvature. ROM normal. No CVA tenderness.   Lungs:   Clear to auscultation bilaterally.   Heart:  Regular rate and rhythm, S1, S2 normal, no murmur, click, rub or gallop.   Abdomen:   Soft, non-tender. Bowel sounds normal. No masses,  No organomegaly.       No lymphadenopathy in all 4 extremities  Alignment-she  has near normal alignment of her left elbow clinically with  only minimal deformity clinically  Range of motion-she has pain with any range of motion of her left elbow  Vascular-distal pulses palpable in left upper extremity  Sensory/motor-deep tendon reflexes normal left upper extremity.  Motor and sensory function intact.  Stability-I have not tested the stability because I have noted that she is subluxated on x-ray  Tenderness to palpation throughout the left elbow area  Skin-her actual skin incision appears to be doing well there is no redness no purulent drainage  Gait-she has a normal gait    Assessment:     @HPROB @  Left distal humerus fracture with subluxation of ulnohumeral joint    Xrays and or studies:    Indications-follow-up left elbow fracture dislocation, findings-AP and lateral views of the left elbow shows that the ulnohumeral joint so essentially the elbow articulation has subluxed and is definitely different than her immediate postoperative films.  It does appear that the reconstruction of the distal humerus as well as the olecranon with this plate and screw construct are all intact it does appear that this is more a issue with the actual ulnohumeral and elbow joint versus the bones themselves.  Impression-left elbow distal humerus fracture now with subluxation of the ulnohumeral joint  Plan:     So I have spoken with the patient at length regarding the fact that obviously this is the reason that I thought was very important to follow her up closely as I was concerned that this could be a pretty significantly unstable left elbow joint.  And as result of her postoperative films today it does appear that this has slipped and is definitely different than her immediate postoperative films.  As result I think the most reasonable thing is to return to the operating room and I have explained that the primary plan will be to see if we can just get this to be reduced and completely concentrically reduced  without making a more formal incision.  If we are able to do this and then we would likely just try to place the patient in a hinged external fixator and make sure that the ulnohumeral joint stays reduced.  If I am unable to get a adequate closed reduction of the ulnohumeral and radiocapitellar joints with just closed means and I may have to go back to where her incision and perform a more formal open reduction and perhaps even some revision fixation of either her distal humerus proximal olecranon or both.  They seem to understand this.  So the plan is to return to the operating room tomorrow and we will have the Synthes hinged elbow external fixator available as well as the elbow plates that in case we need to revise any of the fixation.  So we will proceed with revision open treatment of left elbow fracture dislocation tomorrow in the operating room with possible application of external fixator    Signed By: , MD     June 30, 2021

## 2021-06-30 NOTE — Op Note (Signed)
PROCEDURE:     53F Ure-Sil device removal    INDICATION:    Resolved LEFT PNEUMOTHORAX    DESCRIPTION:    After obtaining a CXR which confirmed no recurrent pneumothorax, the ure-sil device was evaluated. There was no movement of the diaphragm, indicating no ongoing air leak.    After loosening the adhesive around the device, in one smooth movement and with the patient performing valsalva maneuver, the entire ure-sil device was removed. Vaseline gauze was applied over the previous insertion site and then covered over by 4x4's and taped in place.    RECOMMENDATION:    The patient was instructed to contact us or report to the ER immediately if they were to experience any increased shortness of breath.    She will be set up for office follow up in 1-2 weeks with a CT of the chest (both to follow up the resolution of the PTX as well as to monitor a 39mm LLL nodule seen on calcium scoring CT scan done at [facility] in May 2022.     Cecilio Asper, MD

## 2021-06-30 NOTE — Interval H&P Note (Signed)
Update History & Physical    The patient's History and Physical of February 13,  was reviewed with the patient and I examined the patient. There was no change. The surgical site was confirmed by the patient and me.     Plan: The risks, benefits, expected outcome, and alternative to the recommended procedure have been discussed with the patient. Patient understands and wants to proceed with the procedure.     Electronically signed by Cecilio Asper, MD on 06/30/2021 at 9:03 AM

## 2021-06-30 NOTE — H&P (View-Only) (Signed)
History and physical    Patient: Debbie Gonzalez MRN: [id number]  SSN: [id number]    Date of Birth: 01/04/1956  Age: 66 y.o.  Sex: female      Subjective:      Debbie Gonzalez is a 66 y.o. female who is here today in follow-up.  She underwent open reduction internal fixation of the distal humerus fracture just 1 week ago.  This was a distal humerus fracture and involve a significant portion of the capitellum and the lateral condyle area.  It was done through a olecranon osteotomy.  A couple of things that transpired during the case was the fact that this was a very comminuted fracture and she had a lot of comminution in the area where the lateral ligaments attach to the distal humerus so this was sort of pulled off as a sleeve really for the lateral ligaments attached instead of being the ligaments off of the humerus they were actually pulled off with some bone.  She did have a pretty good bit of comminution and the articular surface of the distal humerus I treated this operatively with multiple countersunk lag screws.  I also repaired her lateral ligaments.  Her olecranon osteotomy when it came time to repair this with a cannulated screw and with my reduction forceps the proximal fragment of the olecranon osteotomy just basically broken about 4 pieces with my reduction forceps and as a result I had to abandon the fixation of the Licon osteotomy with 7.3 cannulated screw fixation instead fix this with an olecranon plate.  After this she seemed like she was doing well her postoperative films were very appropriate and was pleased with the way these look but I had been concerned about the fact that she did seem to have some instability the ulnohumeral joint even after fixation of her lateral ligaments so I was a little bit concerned but once I had her flex to 90 in a splint she seems stable and her postoperative films seems stable.  However I did tell her and her husband I thought was very important to follow  this up early and get x-rays about a week out just to make sure that her ulnohumeral joint was still reduced because I was concerned about the stability of this.  She has had a lot of pain and swelling.  The other thing she has had as this has been complicated by the fact that she had a pneumothorax after her regional block for her surgical intervention and so she has had to deal with this as well and has been to seen pulmonary on Monday and today of this week.  So she has had quite a bit of issues unfortunately.  Still having a lot of pain but the pain medicine is helping and she just is little bit concerned about all the swelling she is having as well.    Past Medical History:   Diagnosis Date    Anxiety     Closed fracture of capitellum of distal humerus, left, initial encounter 06/24/2021    Dr. Zoe Lan    Complication of neuromuscular block     patient states "chest tube placed after left elbow surgery because of punctured lung from block they gave me"    Hx of chest tube placement     chest tube removed 06/30/2021    Illness, unspecified     patient had scopalamine patch placed prior to ORIF of left elbow but does not have hx of N&V w/  anesthesia     Past Surgical History:   Procedure Laterality Date    COLONOSCOPY      CYST REMOVAL Left     top of left foot    FRACTURE SURGERY Left 06/24/2021    HUMERUS FRACTURE SURGERY Left 06/24/2021    ORIF LEFT CAPITELLUM FX W/OLECRANON OSTEOTOMY performed by Trudi Ida, MD at [facility] MAIN OR    TUBAL LIGATION      WISDOM TOOTH EXTRACTION        FAMHX -No history of inflammatory arthritis   Social History     Tobacco Use    Smoking status: Never    Smokeless tobacco: Never   Substance Use Topics    Alcohol use: Yes     Alcohol/week: 14.0 standard drinks     Types: 14 Cans of beer per week      Current Outpatient Medications   Medication Sig Dispense Refill    ibuprofen (ADVIL;MOTRIN) 200 MG tablet Take 600 mg by mouth every 6 hours as needed for Pain      estradiol  (ESTRACE) 0.1 MG/GM vaginal cream Place 1 g vaginally      ALPRAZolam (XANAX) 1 MG tablet Take 1 mg by mouth nightly as needed.      oxyCODONE-acetaminophen (PERCOCET) 5-325 MG per tablet Take 1 tablet by mouth every 4 hours.      Multiple Vitamins-Minerals (PRESERVISION AREDS 2+MULTI VIT PO) Take 1 tablet by mouth in the morning and at bedtime      docusate sodium (COLACE) 100 MG capsule Take 100 mg by mouth as needed for Constipation      sertraline (ZOLOFT) 100 MG tablet Take 100 mg by mouth daily      Multiple Vitamin (MULTIVITAMIN ADULT PO) Take by mouth nightly      VITAMIN D PO Take by mouth nightly      VITAMIN E PO Take by mouth nightly      ferrous sulfate (IRON 325) 325 (65 Fe) MG tablet Take 325 mg by mouth nightly      Calcium-Vitamin D-Vitamin K (VIACTIV PO) Take by mouth nightly       No current facility-administered medications for this visit.        No Known Allergies    Review of Systems:  A comprehensive review of systems was negative.    Objective:     There were no vitals filed for this visit.     Physical Exam:  Physical Exam:  General:  Alert, cooperative, no distress, appears stated age. Orientation is alert and oriented person place time and situation   Eyes:  Conjunctivae/corneas clear. PERRL, EOMs intact. Fundi benign   Ears:  Normal TMs and external ear canals both ears.   Nose: Nares normal. Septum midline. Mucosa normal. No drainage or sinus tenderness.   Mouth/Throat: Lips, mucosa, and tongue normal. Teeth and gums normal.   Neck: Supple, symmetrical, trachea midline, no adenopathy, thyroid: no enlargment/tenderness/nodules, no carotid bruit and no JVD.   Back:   Symmetric, no curvature. ROM normal. No CVA tenderness.   Lungs:   Clear to auscultation bilaterally.   Heart:  Regular rate and rhythm, S1, S2 normal, no murmur, click, rub or gallop.   Abdomen:   Soft, non-tender. Bowel sounds normal. No masses,  No organomegaly.       No lymphadenopathy in all 4 extremities  Alignment-she  has near normal alignment of her left elbow clinically with only minimal deformity clinically  Range of motion-she has  pain with any range of motion of her left elbow  Vascular-distal pulses palpable in left upper extremity  Sensory/motor-deep tendon reflexes normal left upper extremity.  Motor and sensory function intact.  Stability-I have not tested the stability because I have noted that she is subluxated on x-ray  Tenderness to palpation throughout the left elbow area  Skin-her actual skin incision appears to be doing well there is no redness no purulent drainage  Gait-she has a normal gait    Assessment:     @HPROB @  Left distal humerus fracture with subluxation of ulnohumeral joint    Xrays and or studies:    Indications-follow-up left elbow fracture dislocation, findings-AP and lateral views of the left elbow shows that the ulnohumeral joint so essentially the elbow articulation has subluxed and is definitely different than her immediate postoperative films.  It does appear that the reconstruction of the distal humerus as well as the olecranon with this plate and screw construct are all intact it does appear that this is more a issue with the actual ulnohumeral and elbow joint versus the bones themselves.  Impression-left elbow distal humerus fracture now with subluxation of the ulnohumeral joint  Plan:     So I have spoken with the patient at length regarding the fact that obviously this is the reason that I thought was very important to follow her up closely as I was concerned that this could be a pretty significantly unstable left elbow joint.  And as result of her postoperative films today it does appear that this has slipped and is definitely different than her immediate postoperative films.  As result I think the most reasonable thing is to return to the operating room and I have explained that the primary plan will be to see if we can just get this to be reduced and completely concentrically reduced  without making a more formal incision.  If we are able to do this and then we would likely just try to place the patient in a hinged external fixator and make sure that the ulnohumeral joint stays reduced.  If I am unable to get a adequate closed reduction of the ulnohumeral and radiocapitellar joints with just closed means and I may have to go back to where her incision and perform a more formal open reduction and perhaps even some revision fixation of either her distal humerus proximal olecranon or both.  They seem to understand this.  So the plan is to return to the operating room tomorrow and we will have the Synthes hinged elbow external fixator available as well as the elbow plates that in case we need to revise any of the fixation.  So we will proceed with revision open treatment of left elbow fracture dislocation tomorrow in the operating room with possible application of external fixator    Signed By: Trudi IdaKyle E Tinley Rought, MD     June 30, 2021

## 2021-06-30 NOTE — Progress Notes (Signed)
Uresil removed without any difficulty by Dr Waynette Buttery. Sterile dressing applied and discharge instructions given pt discharged into spouse's care ambulating out of department.

## 2021-06-30 NOTE — Other (Addendum)
Dear Mrs. Sheriff,      Thank you for completing your phone assessment with me today. Here are your requested surgery instructions. Please call #[phone] with any questions/concerns.    Your surgery is scheduled at [facility]: [address], [place], [address]. Please arrive at [facility].      The Pre-op department [phone] for MAIN OR) will call you on the business day before your surgery with your arrival time. If you have any questions on the day of surgery, please call the pre-op department at the telephone number above. If you have not received a call from Main Pre op by 4 pm the business day before surgery please call MAIN # listed above - thank you!     If you are sick the day of surgery: fever >100 deg F, coughing up colored mucus, or have abdominal sickness (intractable nausea, vomiting or diarrhea) please call [phone] the day of surgery as early as possible and speak to a nurse about your symptoms. They will advise you on next steps.     No food or drink after midnight which includes any gum, mints, candy, or ice chips. Please take these medications on the morning of surgery with a small sip of water: sertraline, xanax if needed.       Please stop all vitamins/supplements prior to surgery and stop all NSAIDS (ibuprofen, naproxen, aleve, motrin, advil) before your surgery.     A responsible adult must drive you to the hospital, remain in the building during surgery and you will need adult supervision for 24 hours after anesthesia.    Please use an antibacterial soap (Dial, Safeguard, etc.) the night before surgery and on the morning of surgery. Do NOT wear: deodorant, make-up, nail polish, lotions, cologne, perfumes, powders or oil on your skin. All piercings/metal/jewelry must be removed prior to arrival.  If you wear contacts then you will need to bring a case to store them in or wear your glasses. Artificial nails are not permitted. Do the best you can with  your bathing night before and morning of surgery, bathing cloths, sponge bath as best you can - please change sheets on bed night before surgery.     Please leave all your valuables at home but be sure to bring your insurance card and ID on the day of surgery for registration/identification.      Our Guide to Surgery with additional information can be found:  [email or web address]    Emailed to: [email or web address]; patient does not have [employer]

## 2021-07-01 ENCOUNTER — Inpatient Hospital Stay
Admit: 2021-07-01 | Discharge: 2021-07-04 | Disposition: A | Discharge status: Home / Self Care | Payer: MEDICARE | Source: Ambulatory Visit | Attending: Orthopaedic Surgery | Admitting: Orthopaedic Surgery

## 2021-07-01 ENCOUNTER — Inpatient Hospital Stay: Admit: 2021-07-01 | Payer: MEDICARE | Primary: Internal Medicine

## 2021-07-01 ENCOUNTER — Ambulatory Visit: Admit: 2021-07-01 | Payer: MEDICARE | Primary: Internal Medicine

## 2021-07-01 DIAGNOSIS — S42452A Displaced fracture of lateral condyle of left humerus, initial encounter for closed fracture: Secondary | ICD-10-CM

## 2021-07-01 DIAGNOSIS — S42402D Unspecified fracture of lower end of left humerus, subsequent encounter for fracture with routine healing: Secondary | ICD-10-CM

## 2021-07-01 MED ORDER — CEFAZOLIN 2000 MG IN 20 ML SWFI IV SYRINGE (PREMIX)
Status: AC
Start: 2021-07-01 — End: 2021-07-01
  Administered 2021-07-01: 13:00:00 2000 mg via INTRAVENOUS

## 2021-07-01 MED ORDER — LIDOCAINE HCL (PF) 2 % IJ SOLN
2 % | INTRAMUSCULAR | Status: DC | PRN
  Administered 2021-07-01: 13:00:00 60 via INTRAVENOUS

## 2021-07-01 MED ORDER — LACTATED RINGERS IV SOLN
INTRAVENOUS | Status: DC
Start: 2021-07-01 — End: 2021-07-01
  Administered 2021-07-01 (×3): via INTRAVENOUS

## 2021-07-01 MED ORDER — LIDOCAINE HCL (PF) 2 % IJ SOLN
2 % | INTRAMUSCULAR | Status: AC
Start: 2021-07-01 — End: ?

## 2021-07-01 MED ORDER — FENTANYL CITRATE (PF) 100 MCG/2ML IJ SOLN
1002 MCG/2ML | INTRAMUSCULAR | Status: AC
Start: 2021-07-01 — End: ?

## 2021-07-01 MED ORDER — HALOPERIDOL LACTATE 5 MG/ML IJ SOLN
5 MG/ML | Freq: Once | INTRAMUSCULAR | Status: DC | PRN
Start: 2021-07-01 — End: 2021-07-01

## 2021-07-01 MED ORDER — KETOROLAC TROMETHAMINE 30 MG/ML IJ SOLN
30 MG/ML | Freq: Once | INTRAMUSCULAR | Status: AC
Start: 2021-07-01 — End: 2021-07-01
  Administered 2021-07-01: 17:00:00 30 mg via INTRAVENOUS

## 2021-07-01 MED ORDER — SERTRALINE HCL 50 MG PO TABS
50 MG | Freq: Every day | ORAL | Status: DC
Start: 2021-07-01 — End: 2021-07-01

## 2021-07-01 MED ORDER — SERTRALINE HCL 50 MG PO TABS
50 MG | Freq: Every evening | ORAL | Status: DC
Start: 2021-07-01 — End: 2021-07-04
  Administered 2021-07-02 – 2021-07-04 (×3): 50 mg via ORAL

## 2021-07-01 MED ORDER — HYDRALAZINE HCL 20 MG/ML IJ SOLN
20 MG/ML | INTRAMUSCULAR | Status: DC | PRN
Start: 2021-07-01 — End: 2021-07-01

## 2021-07-01 MED ORDER — HYDROMORPHONE HCL PF 1 MG/ML IJ SOLN
1 MG/ML | INTRAMUSCULAR | Status: DC | PRN
Start: 2021-07-01 — End: 2021-07-04
  Administered 2021-07-01 – 2021-07-02 (×3): 0.5 mg via INTRAVENOUS

## 2021-07-01 MED ORDER — NORMAL SALINE FLUSH 0.9 % IV SOLN
0.9 % | INTRAVENOUS | Status: DC | PRN
Start: 2021-07-01 — End: 2021-07-01

## 2021-07-01 MED ORDER — ONDANSETRON HCL 4 MG/2ML IJ SOLN
4 MG/2ML | INTRAMUSCULAR | Status: DC | PRN
Start: 2021-07-01 — End: 2021-07-01
  Administered 2021-07-01: 14:00:00 4 via INTRAVENOUS

## 2021-07-01 MED ORDER — NORMAL SALINE FLUSH 0.9 % IV SOLN
0.9 | INTRAVENOUS | Status: DC | PRN
Start: 2021-07-01 — End: 2021-07-04

## 2021-07-01 MED ORDER — HYDROMORPHONE HCL 2 MG/ML IJ SOLN
2 | INTRAMUSCULAR | Status: AC | PRN
Start: 2021-07-01 — End: 2021-07-01
  Administered 2021-07-01 (×4): 0.5 mg via INTRAVENOUS

## 2021-07-01 MED ORDER — HYDROMORPHONE HCL 2 MG/ML IJ SOLN
2 MG/ML | INTRAMUSCULAR | Status: DC | PRN
Start: 2021-07-01 — End: 2021-07-01
  Administered 2021-07-01: 15:00:00 0.5 mg via INTRAVENOUS

## 2021-07-01 MED ORDER — NORMAL SALINE FLUSH 0.9 % IV SOLN
0.9 % | Freq: Two times a day (BID) | INTRAVENOUS | Status: DC
Start: 2021-07-01 — End: 2021-07-04
  Administered 2021-07-02 – 2021-07-03 (×3): 5 mL via INTRAVENOUS
  Administered 2021-07-03 – 2021-07-04 (×2): 10 mL via INTRAVENOUS
  Administered 2021-07-04: 14:00:00 5 mL via INTRAVENOUS

## 2021-07-01 MED ORDER — SCOPOLAMINE 1 MG/3DAYS TD PT72
13 MG/3DAYS | Freq: Once | TRANSDERMAL | Status: DC
Start: 2021-07-01 — End: 2021-07-01
  Administered 2021-07-01: 12:00:00 1 via TRANSDERMAL

## 2021-07-01 MED ORDER — NOZIN NASAL SANITIZER 62 % NA KIT
62 % | Freq: Once | NASAL | Status: AC
Start: 2021-07-01 — End: 2021-07-01
  Administered 2021-07-01: 11:00:00 3 via TOPICAL

## 2021-07-01 MED ORDER — APREPITANT 40 MG PO CAPS
40 MG | Freq: Every day | ORAL | Status: DC
Start: 2021-07-01 — End: 2021-07-01
  Administered 2021-07-01: 12:00:00 40 mg via ORAL

## 2021-07-01 MED ORDER — IPRATROPIUM-ALBUTEROL 0.5-2.5 (3) MG/3ML IN SOLN
0.5-2.533 (3) MG/3ML | Freq: Once | RESPIRATORY_TRACT | Status: DC | PRN
Start: 2021-07-01 — End: 2021-07-01

## 2021-07-01 MED ORDER — PROPOFOL 200 MG/20ML IV EMUL
200 MG/20ML | INTRAVENOUS | Status: AC
Start: 2021-07-01 — End: ?

## 2021-07-01 MED ORDER — ONDANSETRON HCL 4 MG/2ML IJ SOLN
4 MG/2ML | Freq: Four times a day (QID) | INTRAMUSCULAR | Status: DC | PRN
Start: 2021-07-01 — End: 2021-07-04

## 2021-07-01 MED ORDER — PROCHLORPERAZINE EDISYLATE 10 MG/2ML IJ SOLN
10 MG/2ML | Freq: Once | INTRAMUSCULAR | Status: DC | PRN
Start: 2021-07-01 — End: 2021-07-01

## 2021-07-01 MED ORDER — FENTANYL CITRATE (PF) 100 MCG/2ML IJ SOLN
100 MCG/2ML | Freq: Once | INTRAMUSCULAR | Status: DC | PRN
Start: 2021-07-01 — End: 2021-07-01

## 2021-07-01 MED ORDER — SODIUM CHLORIDE 0.9 % IV SOLN (MINI-BAG)
0.9 % | Freq: Three times a day (TID) | INTRAVENOUS | Status: AC
Start: 2021-07-01 — End: 2021-07-03
  Administered 2021-07-01 – 2021-07-03 (×6): 1000 mg via INTRAVENOUS

## 2021-07-01 MED ORDER — DIPHENHYDRAMINE HCL 50 MG/ML IJ SOLN
50 MG/ML | Freq: Once | INTRAMUSCULAR | Status: DC | PRN
Start: 2021-07-01 — End: 2021-07-01

## 2021-07-01 MED ORDER — EPHEDRINE SULFATE-NACL 50-0.9 MG/5ML-% IV SOSY
50-0.95- MG/5ML-% | INTRAVENOUS | Status: AC
Start: 2021-07-01 — End: ?

## 2021-07-01 MED ORDER — ONDANSETRON HCL 4 MG/2ML IJ SOLN
4 MG/2ML | INTRAMUSCULAR | Status: AC
Start: 2021-07-01 — End: ?

## 2021-07-01 MED ORDER — OXYCODONE HCL 5 MG PO TABS
5 MG | ORAL | Status: AC | PRN
Start: 2021-07-01 — End: 2021-07-01

## 2021-07-01 MED ORDER — MIDAZOLAM HCL (PF) 2 MG/2ML IJ SOLN
2 MG/ML | Freq: Once | INTRAMUSCULAR | Status: DC | PRN
Start: 2021-07-01 — End: 2021-07-01

## 2021-07-01 MED ORDER — PROPOFOL 200 MG/20ML IV EMUL
200 MG/20ML | INTRAVENOUS | Status: DC | PRN
Start: 2021-07-01 — End: 2021-07-01
  Administered 2021-07-01: 13:00:00 150 via INTRAVENOUS

## 2021-07-01 MED ORDER — FENTANYL CITRATE (PF) 100 MCG/2ML IJ SOLN
100 MCG/2ML | INTRAMUSCULAR | Status: DC | PRN
Start: 2021-07-01 — End: 2021-07-01
  Administered 2021-07-01 (×2): 50 via INTRAVENOUS
  Administered 2021-07-01 (×2): 25 via INTRAVENOUS
  Administered 2021-07-01: 14:00:00 50 via INTRAVENOUS

## 2021-07-01 MED ORDER — LABETALOL HCL 5 MG/ML IV SOLN
5 | INTRAVENOUS | Status: DC | PRN
Start: 2021-07-01 — End: 2021-07-01

## 2021-07-01 MED ORDER — NORMAL SALINE FLUSH 0.9 % IV SOLN
0.9 % | Freq: Two times a day (BID) | INTRAVENOUS | Status: DC
Start: 2021-07-01 — End: 2021-07-01

## 2021-07-01 MED ORDER — SODIUM CHLORIDE 0.9 % IV SOLN
0.9 % | INTRAVENOUS | Status: DC | PRN
Start: 2021-07-01 — End: 2021-07-04

## 2021-07-01 MED ORDER — HYDROMORPHONE HCL 2 MG/ML IJ SOLN
2 MG/ML | INTRAMUSCULAR | Status: DC | PRN
Start: 2021-07-01 — End: 2021-07-04
  Administered 2021-07-01: 16:00:00 0.5 mg via INTRAVENOUS

## 2021-07-01 MED ORDER — SODIUM CHLORIDE 0.9 % IV SOLN
0.9 % | INTRAVENOUS | Status: DC | PRN
Start: 2021-07-01 — End: 2021-07-01

## 2021-07-01 MED ORDER — ALPRAZOLAM 0.5 MG PO TABS
0.5 MG | Freq: Three times a day (TID) | ORAL | Status: DC | PRN
Start: 2021-07-01 — End: 2021-07-01

## 2021-07-01 MED ORDER — EPHEDRINE SULFATE-NACL 50-0.9 MG/5ML-% IV SOSY
INTRAVENOUS | Status: DC | PRN
Start: 2021-07-01 — End: 2021-07-01
  Administered 2021-07-01 (×3): 10 via INTRAVENOUS

## 2021-07-01 MED ORDER — LIDOCAINE HCL 1 % IJ SOLN
1 % | Freq: Once | INTRAMUSCULAR | Status: DC | PRN
Start: 2021-07-01 — End: 2021-07-01

## 2021-07-01 MED ORDER — OXYCODONE HCL 5 MG PO TABS
5 MG | ORAL | Status: AC | PRN
Start: 2021-07-01 — End: 2021-07-01
  Administered 2021-07-01: 17:00:00 10 mg via ORAL

## 2021-07-01 MED ORDER — KETAMINE HCL 20 MG/2ML IJ SOSY
20 MG/2ML | INTRAMUSCULAR | Status: AC | PRN
Start: 2021-07-01 — End: 2021-07-01
  Administered 2021-07-01 (×3): 10 mg via INTRAVENOUS

## 2021-07-01 MED ORDER — OXYCODONE HCL 15 MG PO TABS
15 | ORAL | Status: DC | PRN
Start: 2021-07-01 — End: 2021-07-04
  Administered 2021-07-01 – 2021-07-04 (×10): 15 mg via ORAL

## 2021-07-01 MED ORDER — POLYETHYLENE GLYCOL 3350 17 G PO PACK
17 g | Freq: Every day | ORAL | Status: DC | PRN
Start: 2021-07-01 — End: 2021-07-04

## 2021-07-01 MED ORDER — ONDANSETRON 4 MG PO TBDP
4 MG | Freq: Three times a day (TID) | ORAL | Status: DC | PRN
Start: 2021-07-01 — End: 2021-07-04

## 2021-07-01 MED FILL — APREPITANT 40 MG PO CAPS: 40 MG | ORAL | Qty: 1

## 2021-07-01 MED FILL — SCOPOLAMINE 1 MG/3DAYS TD PT72: 1 MG/3DAYS | TRANSDERMAL | Qty: 1

## 2021-07-01 MED FILL — OXYCODONE HCL 15 MG PO TABS: 15 MG | ORAL | Qty: 1

## 2021-07-01 MED FILL — CEFAZOLIN 2000 MG IN 20 ML SWFI IV SYRINGE (PREMIX): Qty: 2000

## 2021-07-01 MED FILL — KETAMINE HCL 20 MG/2ML IJ SOSY: 20 MG/2ML | INTRAMUSCULAR | Qty: 2

## 2021-07-01 MED FILL — FENTANYL CITRATE (PF) 100 MCG/2ML IJ SOLN: 100 MCG/2ML | INTRAMUSCULAR | Qty: 2

## 2021-07-01 MED FILL — HYDROMORPHONE HCL 2 MG/ML IJ SOLN: 2 MG/ML | INTRAMUSCULAR | Qty: 1

## 2021-07-01 MED FILL — ONDANSETRON HCL 4 MG/2ML IJ SOLN: 4 MG/2ML | INTRAMUSCULAR | Qty: 2

## 2021-07-01 MED FILL — OXYCODONE HCL 5 MG PO TABS: 5 MG | ORAL | Qty: 2

## 2021-07-01 MED FILL — KETOROLAC TROMETHAMINE 30 MG/ML IJ SOLN: 30 MG/ML | INTRAMUSCULAR | Qty: 1

## 2021-07-01 MED FILL — LIDOCAINE HCL (PF) 2 % IJ SOLN: 2 % | INTRAMUSCULAR | Qty: 5

## 2021-07-01 MED FILL — EPHEDRINE SULFATE-NACL 50-0.9 MG/5ML-% IV SOSY: INTRAVENOUS | Qty: 5

## 2021-07-01 MED FILL — DIPRIVAN 200 MG/20ML IV EMUL: 200 MG/20ML | INTRAVENOUS | Qty: 20

## 2021-07-01 MED FILL — HYDROMORPHONE HCL 1 MG/ML IJ SOLN: 1 MG/ML | INTRAMUSCULAR | Qty: 1

## 2021-07-01 MED FILL — CEFAZOLIN SODIUM 1 G IJ SOLR: 1 g | INTRAMUSCULAR | Qty: 1000

## 2021-07-01 NOTE — Progress Notes (Signed)
Orthopedic Update:       I was asked to examen this patient postoperatively. She is now S/P revision ORIF of the left olecranon osteotomy and external fixator application of the left elbow. She is awake, alert and appropriate. She does report some numbness that has improved since loosening her ace wrap. Her swelling in the left hand is appropriate given the fact that she had two elbow surgeries now in a short period of time. She has expected ecchymosis of the hand that is unchanged from before surgery. On exam each digit of the left hand is perfusing well with good capillary refill of the tips of each finger. She is able to flex and extend her left wrist. Her radial nerve is intact and her thumb flexes and extends normally. There is no bleeding from her pin sites. Her left arm is in a sling. There are no findings at this time concerning for any postoperative complications.

## 2021-07-01 NOTE — Telephone Encounter (Signed)
Please review CXR

## 2021-07-01 NOTE — Op Note (Signed)
Operative Report    Patient: Debbie Gonzalez MRN: [id number]  SSN: [id number]    Date of Birth: Jun 23, 1955  Age: 66 y.o.  Sex: female       Date of Surgery: July 01, 2021     History:  Debbie Gonzalez is a 66 y.o. female who had a left distal humerus fracture treated with formal open reduction internal fixation via an olecranon osteotomy approximately 1 week prior.  She also has some pretty significant instability of the ulnohumeral articulation and I was concerned about this postoperatively and explained this to the patient and family that I thought was very important that we follow her very closely with postoperative x-rays as her x-rays in the recovery room looked to be excellent and there was an excellent reduction of the elbow itself as well as of the olecranon osteotomy and the distal humerus is very pleased with these x-rays.  However her x-rays at 1 week follow-up showed some subluxation of her ulnohumeral joint.  As result of this I did think it was important to go back to the operating room and make sure this was concentrically reduced also to make sure that the olecranon itself had not lost some of its fixation and I explained that we would likely be placing her in a hinged elbow external fixator just to try to avoid any future instability and try to make sure that we were able to maintain a nice concentric reduction of this obviously unstable elbow joint.  After talking to the patient and her husband about this they seem to feel comfortable consenting.      I talked to the patient and/or their representative and explained the exact nature the procedure.  I also went through a detailed list of the material risks associated with  the procedure which included risk of bleeding, infection, injury to nearby structures, worsening the situation, as well as the risks associate with anesthesia and finally death.  Also talked with him regarding the benefits and alternatives to the  procedure.    Preoperative Diagnosis: Elbow fracture, left [S42.402A]  Closed dislocation of left elbow [S53.105A]     Postoperative Diagnosis:   Closed displaced left distal humerus lateral condyle fracture , left elbow dislocation      Surgeon(s) and Role:     * Trudi Ida, MD - Primary    Anesthesia: Choice     Procedure: Revision open treatment of left elbow fracture dislocation with revision fixation of olecranon osteotomy and application of hinged elbow external fixator    Procedure in Detail: After the successful duction of general anesthetic the left upper extremity was prepped and draped in usual sterile fashion.  I then brought in the mini C arm image intensifier and it did appear that there was some residual subluxation of the ulnohumeral articulation and even after we were able to sort of reduce this closed I was concerned about whether or not the integrity of the olecranon was really intact with the plate and screw fixation so I decided to go ahead and inflate the tourniquet to 250 mmHg and open up her previous incision.  I then removed the plate that had been previously placed over the olecranon.  Once I did this I was able to reduce the elbow joint itself well and then I decided to try different fixation technique I placed 2 K wires through the articular surface just underneath the articular surface of the olecranon so that I knew for sure that in the  olecranon fragment they were out of the articular surface and then reduced the articular portion of the olecranon to the intact ulnar shaft advanced the K wires and then placed a tension band wire over the olecranon holding the other fragments of the olecranon reduced with reduction forceps while I tensioned the tension band for the olecranon fracture.  Once I had this all reduced I checked my reduction clinically as well as radiographic I was very pleased with this the ulnohumeral articulation was well reduced as well as the olecranon itself.  This  appeared to be much more stable through a gentle range of motion.  I then bent and cut my K wires and then also try to make sure that the tension band was buried in soft tissue so this was not too prominent.  At that point since she has had so much issue with instability I thought it was wise to go ahead and place her in a hinged external fixator.  I then placed 2 pins in the ulna and then very carefully placed 2 pins in the humerus using small incisions as well as sleeves from the Synthes medium external fixator set to make sure there was no evidence of any soft tissue as we swept the soft tissue around the lateral aspect of the distal humerus before placing the 2 Schanz pins in the distal humerus.  After doing this and having the pins in the ulna as well as in the humerus I also placed a reference wire down the axis of the capitellum essentially and once this was in appropriate position I then fashioned an external fixator taking great care that the hinge was well seated around the reference wire as I basically built the external fixator to the hinge and reference wire.  After doing this and attaching my external fixator with carbon fiber rods and the elbow hinge I then gently took the patient to her range of motion and she appeared to have a very reasonable range of motion in the olecranon was stable in the external fixator.  I then added an additional carbon fiber rod to statically lock the external fixator with the plans being to allow this to be static at first and then this 1 part of the external fixator could be removed to allow for her to do range of motion with the external fixator in place.  I remove the reference wire from the distal humerus and then irrigated with a copious amount normal saline.  I then closed the wound starting with some 0 Prolene sutures using these as an sort of large retention sutures to try to take the tension off the skin itself and then closed the skin with a running 2-0 nylon  suture.  I then used the mini C arm once more just to make sure that was pleased with the reduction of the elbow and indeed I was I saved these images as I thought these would likely be better to make sure that we truly documented that the ulnohumeral joint was well reduced and then placed dressings over the left upper extremity.  She was then awakened and taken recovery in stable condition      Estimated Blood Loss: 90 cc    Tourniquet Time:   Total Tourniquet Time Documented:  Arm  (Left) - 53 minutes  Total: Arm  (Left) - 53 minutes        Implants: * No implants in log *  Specimens: * No specimens in log *        Drains: None                Complications: None    Counts: Sponge and needle counts were correct times two.    Signed By:  Trudi Ida, MD     July 01, 2021

## 2021-07-01 NOTE — Telephone Encounter (Signed)
Patient needs a form for [facility] . That states Dr. Payton Mccallum B wanting patient on this med. If then they do not approve then will go to Formulary    Trelegy

## 2021-07-01 NOTE — Anesthesia Post-Procedure Evaluation (Signed)
Department of Anesthesiology  Postprocedure Note    Patient: Debbie Gonzalez  MRN: [id number]  Birthdate: 02/16/1956  Date of evaluation: 07/01/2021      Procedure Summary     Date: 07/01/21 Room / Location: [facility]IN OR 07 / [facility] MAIN OR    Anesthesia Start: 0734 Anesthesia Stop: 0949    Procedure: ORIF/REVISION LEFT ELBOW FX/DIS - Possible External Fixator (Left: Elbow) Diagnosis:       Elbow fracture, left      Closed dislocation of left elbow      (Elbow fracture, left [S42.402A])      (Closed dislocation of left elbow [S53.105A])    Providers: Trudi Ida, MD Responsible Provider: Barbaraann Faster, MD    Anesthesia Type: general ASA Status: 2          Anesthesia Type: No value filed.    Aldrete Phase I: Aldrete Score: 8    Aldrete Phase II:        Anesthesia Post Evaluation    Patient location during evaluation: PACU  Patient participation: complete - patient participated  Level of consciousness: awake and alert  Airway patency: patent  Nausea & Vomiting: no nausea and no vomiting  Complications: no  Cardiovascular status: hemodynamically stable  Respiratory status: acceptable, nonlabored ventilation and spontaneous ventilation  Hydration status: euvolemic  Comments: BP (!) 148/55   Pulse 77   Temp 97.9 F (36.6 C) (Temporal)   Resp 16   Ht 5' 8.5" (1.74 m)   Wt 128 lb (58.1 kg)   SpO2 99%   BMI 19.18 kg/m   Working on pain control. Will send to floor soon  Multimodal analgesia pain management approach

## 2021-07-01 NOTE — Other (Signed)
TRANSFER - OUT REPORT:    Verbal report given to Huntley Dec, RN on Debbie Gonzalez  being transferred to 723 for routine post-op       Report consisted of patient's Situation, Background, Assessment and   Recommendations(SBAR).     Information from the following report(s) Adult Overview, Surgery Report, Intake/Output, and MAR was reviewed with the receiving nurse.    Lines:   Peripheral IV Right Forearm (Active)   Site Assessment Clean, dry & intact 07/01/21 1153   Line Status Infusing 07/01/21 0948   Line Care Connections checked and tightened 07/01/21 0624   Phlebitis Assessment No symptoms 07/01/21 1153   Infiltration Assessment 0 07/01/21 1153   Alcohol Cap Used No 07/01/21 0624   Dressing Status Clean, dry & intact 07/01/21 1153   Dressing Type Transparent 07/01/21 1153   Dressing Intervention New 07/01/21 1610        Opportunity for questions and clarification was provided.      Patient transported with:   O2 @ 3 liters    VTE prophylaxis orders have been written for [facility].    Patient and family given floor number and nurses name.

## 2021-07-01 NOTE — Telephone Encounter (Signed)
Noted that patient is currently in PACU post Elbow fracture, left surgery.  I have notified Dr Waynette Buttery of surgery today requiring intubation today and therefore positive pressure.  Uresil removed yesterday for pneumothorax.  Per Dr Waynette Buttery, CXR prior to discharge to assure L pneumothorax has not re-accumulated. I have spoken with Maggie PACU RN, she is aware.  CXR ordered and she will notify this RRT when completed.

## 2021-07-01 NOTE — Anesthesia Procedure Notes (Signed)
Airway  Date/Time: 07/01/2021 7:46 AM  Urgency: elective    Airway not difficult    General Information and Staff    Patient location during procedure: OR  Resident/CRNA: Imagene Riches, APRN - CRNA  Performed: resident/CRNA     Indications and Patient Condition  Indications for airway management: anesthesia  Spontaneous Ventilation: absent  Sedation level: deep  Preoxygenated: yes  MILS not maintained throughout  Mask difficulty assessment: vent by bag mask    Final Airway Details  Final airway type: supraglottic airway      Successful airway: oropharyngeal  Size 4     Number of attempts at approach: 1  Ventilation between attempts: supraglottic airway  Number of other approaches attempted: 0    no

## 2021-07-01 NOTE — Interval H&P Note (Signed)
Update History & Physical    The patient's History and Physical of , 06/30/2021 was reviewed with the patient and I examined the patient. There was no change. The surgical site was confirmed by the patient and me.     Plan: The risks, benefits, expected outcome, and alternative to the recommended procedure have been discussed with the patient. Patient understands and wants to proceed with revision fixation of left elbow fracture dislocation and possible application of external fixator.     Electronically signed by Trudi Ida, MD on 07/01/2021 at 6:53 AM

## 2021-07-01 NOTE — Anesthesia Pre-Procedure Evaluation (Signed)
Department of Anesthesiology  Preprocedure Note       Name:  Debbie Gonzalez   Age:  66 y.o.  DOB:  1956/01/28                                          MRN:  [id number]         Date:  07/01/2021      Surgeon: Moishe Spice):  Trudi Ida, MD    Procedure: Procedure(s):  ORIF/REVISION LEFT ELBOW FX/DIS - Possible External Fixator    Medications prior to admission:   Prior to Admission medications    Medication Sig Start Date End Date Taking? Authorizing Provider   ibuprofen (ADVIL;MOTRIN) 200 MG tablet Take 600 mg by mouth every 6 hours as needed for Pain    Historical Provider, MD   estradiol (ESTRACE) 0.1 MG/GM vaginal cream Place 1 g vaginally 03/22/20   Historical Provider, MD   ALPRAZolam Prudy Feeler) 1 MG tablet Take 1 mg by mouth nightly as needed.  Patient not taking: Reported on 07/01/2021 06/12/15   Historical Provider, MD   oxyCODONE-acetaminophen (PERCOCET) 5-325 MG per tablet Take 1 tablet by mouth every 4 hours.    Historical Provider, MD   Multiple Vitamins-Minerals (PRESERVISION AREDS 2+MULTI VIT PO) Take 1 tablet by mouth in the morning and at bedtime    Historical Provider, MD   docusate sodium (COLACE) 100 MG capsule Take 100 mg by mouth as needed for Constipation    Historical Provider, MD   sertraline (ZOLOFT) 100 MG tablet Take 100 mg by mouth daily    Historical Provider, MD   Multiple Vitamin (MULTIVITAMIN ADULT PO) Take by mouth nightly    Historical Provider, MD   VITAMIN D PO Take by mouth nightly    Historical Provider, MD   VITAMIN E PO Take by mouth nightly    Historical Provider, MD   ferrous sulfate (IRON 325) 325 (65 Fe) MG tablet Take 325 mg by mouth nightly    Historical Provider, MD   Calcium-Vitamin D-Vitamin K (VIACTIV PO) Take by mouth nightly    Historical Provider, MD       Current medications:    No current facility-administered medications for this visit.     No current outpatient medications on file.     Facility-Administered Medications Ordered in Other Visits   Medication Dose  Route Frequency Provider Last Rate Last Admin   . lidocaine 1 % injection 1 mL  1 mL IntraDERmal Once PRN Christoper Allegra, MD       . fentaNYL (SUBLIMAZE) injection 25 mcg  25 mcg IntraVENous Once PRN Christoper Allegra, MD        Or   . fentaNYL (SUBLIMAZE) injection 100 mcg  100 mcg IntraVENous Once PRN Christoper Allegra, MD       . lactated ringers IV soln infusion   IntraVENous Continuous Christoper Allegra, MD 125 mL/hr at 07/01/21 0626 New Bag at 07/01/21 1694   . sodium chloride flush 0.9 % injection 5-40 mL  5-40 mL IntraVENous 2 times per day Christoper Allegra, MD       . sodium chloride flush 0.9 % injection 5-40 mL  5-40 mL IntraVENous PRN Christoper Allegra, MD       . 0.9 % sodium chloride infusion   IntraVENous PRN Christoper Allegra, MD       . midazolam (VERSED) PF injection 2  mg/2 mL  2 mg IntraVENous Once PRN Christoper Allegra, MD       . ceFAZolin (ANCEF) 2000 mg in sterile water 20 mL IV syringe  2,000 mg IntraVENous On Call to OR Trudi Ida, MD       . scopolamine (TRANSDERM-SCOP) transdermal patch 1 patch  1 patch TransDERmal Once Christoper Allegra, MD       . aprepitant (EMEND) capsule 40 mg  40 mg Oral Daily Christoper Allegra, MD           Allergies:  No Known Allergies    Problem List:    Patient Active Problem List   Diagnosis Code   . Closed fracture of capitellum of distal humerus, left, initial encounter S42.452A   . Pneumothorax on left J93.9   . Pulmonary nodule R91.1   . Elbow fracture, left S42.402A   . Closed dislocation of left elbow S53.105A       Past Medical History:        Diagnosis Date   . Anxiety    . Closed fracture of capitellum of distal humerus, left, initial encounter 06/24/2021    Dr. Zoe Lan   . Complication of neuromuscular block     patient states "chest tube placed after left elbow surgery because of punctured lung from block they gave me"   . Hx of chest tube placement     chest tube removed 06/30/2021   . Illness, unspecified     patient had scopalamine patch placed prior to ORIF of left elbow but does not have  hx of N&V w/ anesthesia       Past Surgical History:        Procedure Laterality Date   . COLONOSCOPY     . CYST REMOVAL Left     top of left foot   . FRACTURE SURGERY Left 06/24/2021   . HUMERUS FRACTURE SURGERY Left 06/24/2021    ORIF LEFT CAPITELLUM FX W/OLECRANON OSTEOTOMY performed by Trudi Ida, MD at [facility] MAIN OR   . TUBAL LIGATION     . WISDOM TOOTH EXTRACTION         Social History:    Social History     Tobacco Use   . Smoking status: Never   . Smokeless tobacco: Never   Substance Use Topics   . Alcohol use: Yes     Alcohol/week: 14.0 standard drinks     Types: 14 Cans of beer per week                                Counseling given: Not Answered      Vital Signs (Current):   There were no vitals filed for this visit.                                           BP Readings from Last 3 Encounters:   07/01/21 (!) 111/58   06/30/21 104/60   06/24/21 (!) 116/57       NPO Status:  BMI:   Wt Readings from Last 3 Encounters:   07/01/21 128 lb (58.1 kg)   06/30/21 125 lb (56.7 kg)   06/24/21 125 lb (56.7 kg)     There is no height or weight on file to calculate BMI.    CBC:   Lab Results   Component Value Date/Time    HGB 12.4 06/24/2021 08:39 AM       CMP: No results found for: NA, K, CL, CO2, BUN, CREATININE, GFRAA, AGRATIO, LABGLOM, GLUCOSE, GLU, PROT, CALCIUM, BILITOT, ALKPHOS, AST, ALT    POC Tests: No results for input(s): POCGLU, POCNA, POCK, POCCL, POCBUN, POCHEMO, POCHCT in the last 72 hours.    Coags: No results found for: PROTIME, INR, APTT    HCG (If Applicable): No results found for: PREGTESTUR, PREGSERUM, HCG, HCGQUANT     ABGs: No results found for: PHART, PO2ART, PCO2ART, HCO3ART, BEART, O2SATART     Type & Screen (If Applicable):  No results found for: LABABO, LABRH    Drug/Infectious Status (If Applicable):  No results found for: HIV, HEPCAB    COVID-19 Screening (If Applicable): No results found for:  COVID19        Anesthesia Evaluation  Patient summary reviewed and Nursing notes reviewed   history of anesthetic complications: PONV.  Airway: Mallampati: II  TM distance: >3 FB   Neck ROM: full  Mouth opening: > = 3 FB   Dental: normal exam         Pulmonary:Negative Pulmonary ROS and normal exam                               Cardiovascular:Negative CV ROS  Exercise tolerance: good (>4 METS),           Rhythm: regular  Rate: normal                    Neuro/Psych:   Negative Neuro/Psych ROS              GI/Hepatic/Renal: Neg GI/Hepatic/Renal ROS            Endo/Other: Negative Endo/Other ROS             Pt had no PAT visit       Abdominal:             Vascular: negative vascular ROS.         Other Findings:             Anesthesia Plan      general     ASA 2     (Patient concerned for ponv. Emend/scop. Patient declines block today. )  Induction: intravenous.    MIPS: Postoperative opioids intended and Prophylactic antiemetics administered.  Anesthetic plan and risks discussed with patient and spouse.      Plan discussed with surgical team.                    Christoper Allegra, MD   07/01/2021

## 2021-07-01 NOTE — Progress Notes (Signed)
Patient reporting numbness in left fingers, excruciating pain, and severe swelling in left hand.  Dr. Zoe Lan notified, who gave permission to loosen ace wrap on hand.  Patient did report relief in pain after this, but very concerned regarding how swollen the extremity is.

## 2021-07-01 NOTE — Progress Notes (Signed)
TRANSFER - IN REPORT:    Verbal report received from [facility] on Debbie Gonzalez  being received from PACU for routine progression of patient care      Report consisted of patient's Situation, Background, Assessment and   Recommendations(SBAR).     Information from the following report(s) Nurse Handoff Report was reviewed with the receiving nurse.    Opportunity for questions and clarification was provided.      Assessment completed upon patient's arrival to unit and care assumed.

## 2021-07-01 NOTE — Telephone Encounter (Signed)
CXR reviewed.   Fortunately CXR does not show any recurrent PTX which was the reason behind doing the CXR.   However, it does show a small nodular area in the LL that was not seen on recent CXR.   If she is being set up for office follow up would start with PA/lat CXR at that time.     Cecilio Asper, MD

## 2021-07-02 MED ORDER — BLISTEX MEDICATED EX OINT
CUTANEOUS | Status: DC | PRN
Start: 2021-07-02 — End: 2021-07-04
  Administered 2021-07-02: 02:00:00 via TOPICAL

## 2021-07-02 MED ORDER — KETOROLAC TROMETHAMINE 15 MG/ML IJ SOLN
15 MG/ML | Freq: Four times a day (QID) | INTRAMUSCULAR | Status: AC
Start: 2021-07-02 — End: 2021-07-02
  Administered 2021-07-02 (×3): 15 mg via INTRAVENOUS

## 2021-07-02 MED ORDER — ACETAMINOPHEN 325 MG PO TABS
325 | Freq: Four times a day (QID) | ORAL | Status: DC | PRN
Start: 2021-07-02 — End: 2021-07-04
  Administered 2021-07-02 – 2021-07-03 (×3): 650 mg via ORAL

## 2021-07-02 MED ORDER — HYDROMORPHONE HCL PF 1 MG/ML IJ SOLN
1 MG/ML | INTRAMUSCULAR | Status: DC | PRN
Start: 2021-07-02 — End: 2021-07-04
  Administered 2021-07-02 – 2021-07-03 (×3): 1 mg via INTRAVENOUS

## 2021-07-02 MED FILL — OXYCODONE HCL 15 MG PO TABS: 15 MG | ORAL | Qty: 1

## 2021-07-02 MED FILL — KETOROLAC TROMETHAMINE 15 MG/ML IJ SOLN: 15 MG/ML | INTRAMUSCULAR | Qty: 1

## 2021-07-02 MED FILL — ACETAMINOPHEN 325 MG PO TABS: 325 MG | ORAL | Qty: 2

## 2021-07-02 MED FILL — CEFAZOLIN SODIUM 1 G IJ SOLR: 1 g | INTRAMUSCULAR | Qty: 1000

## 2021-07-02 MED FILL — LIP-GUARD EX OINT: CUTANEOUS | Qty: 7

## 2021-07-02 MED FILL — HYDROMORPHONE HCL 1 MG/ML IJ SOLN: 1 MG/ML | INTRAMUSCULAR | Qty: 1

## 2021-07-02 MED FILL — SERTRALINE HCL 50 MG PO TABS: 50 MG | ORAL | Qty: 1

## 2021-07-02 NOTE — Care Coordination-Inpatient (Signed)
Pt presented for fixation left distal humerus fracture and olecranon osteotomy and application of external fixator. Pt is p/o day 1. Pt lives with her spouse in a condo [place]). At baseline, is indep with her ADLs. Drives. On RA. Denies DME use. Denies current home care services. PT/OT consulted and recommended HH v. NN. Pt expressed NN but will follow up at discharge to confirm. PCP and insurance verified. Pt able to afford home medications. Will continue to monitor.       07/02/21 1325   Service Assessment   Patient Orientation Alert and Oriented   Cognition Alert   History Provided By Patient   Primary Caregiver Self   Support Systems Spouse/Significant Other;Family Members;Friends/Neighbors;Church/Faith Community   PCP Verified by CM Yes  Konrad Felix)   Prior Functional Level Independent in ADLs/IADLs   Current Functional Level Independent in ADLs/IADLs   Can patient return to prior living arrangement Yes   Ability to make needs known: Good   Family able to assist with home care needs: Yes   Would you like for me to discuss the discharge plan with any other family members/significant others, and if so, who? Yes   Engineer, maintenance None   Social/Functional History   Lives With Spouse   Type of Home Condo   Home Layout One level   Home Education administrator   ADL Assistance Independent   Homemaking Assistance Independent   Ambulation Assistance Independent   Transfer Assistance Independent   Mode of Engineer, water   Occupation Retired   Engineer, mining Other   Current Services Prior To Admission None   Potential Assistance Needed N/A   DME Ordered? No   Potential Assistance Purchasing Medications No   Type of Home Care Services None   Services At/After Discharge   Transition of Care Consult (CM Consult) Discharge Planning   Services At/After Discharge None   Veteran Resource Information Provided? No   Mode of Transport at Discharge  Other (see comment)  (Family)   Confirm Follow Up Transport Family

## 2021-07-02 NOTE — Progress Notes (Signed)
Progress Note    Patient: Debbie Gonzalez MRN: [id number]  SSN: [id number]    Date of Birth: 12-25-1955  Age: 66 y.o.  Sex: female      Admit Date: 07/01/2021    LOS: 1 day     Subjective:     Patient is having a lot of pain but a little better after Toradol    Objective:     Vitals:    07/02/21 0349 07/02/21 0406 07/02/21 0626 07/02/21 0721   BP:  126/67  122/63   Pulse:  74  76   Resp: 18 17 18 16    Temp:  99.1 F (37.3 C)  98.6 F (37 C)   TempSrc:  Axillary  Oral   SpO2:  100%  95%   Weight:       Height:            Intake and Output:  Current Shift: No intake/output data recorded.  Last three shifts: 02/16 1901 - 02/18 0700  In: 2240 [P.O.:240; I.V.:2000]  Out: 10     alert and oriented to person place time and situation  Skin - dressing clean dry and intact  Motor and sensory function intact in LEFT UPPER extremity, she has intact motor function in radial and ulnar nerve distribution, she does have numbness in small finger  Pulses palpable in LEFT UPPER extremity   Still with sign swelling left hand    Lab/Data Review:  No results for input(s): HGB, HCT in the last 72 hours.       Assessment:    POD 1 from revision fixation left distal humerus fracture and olecranon osteotomy and application of external fixator    Plan:     I have removed her ACE wrap to see if this will help with hand swelling,  cont Ice and elevation.  Toradol seems to have helped quite a bit so will cont this today and hopefully home tomorrow    Signed By: 3/18, MD     July 02, 2021

## 2021-07-02 NOTE — Plan of Care (Signed)
Problem: Pain  Goal: Verbalizes/displays adequate comfort level or baseline comfort level  Outcome: Progressing  Flowsheets (Taken 07/01/2021 2125)  Verbalizes/displays adequate comfort level or baseline comfort level:   Encourage patient to monitor pain and request assistance   Assess pain using appropriate pain scale   Administer analgesics based on type and severity of pain and evaluate response     Problem: Safety - Adult  Goal: Free from fall injury  Outcome: Progressing  Flowsheets (Taken 07/01/2021 2247)  Free From Fall Injury: Instruct family/caregiver on patient safety     Problem: Discharge Planning  Goal: Discharge to home or other facility with appropriate resources  Outcome: Progressing  Flowsheets (Taken 07/01/2021 2050)  Discharge to home or other facility with appropriate resources: Identify barriers to discharge with patient and caregiver     Problem: Neurosensory - Adult  Goal: Achieves maximal functionality and self care  Outcome: Progressing  Flowsheets (Taken 07/01/2021 2050)  Achieves maximal functionality and self care: Encourage and assist patient to increase activity and self care with guidance from physical therapy/occupational therapy     Problem: Skin/Tissue Integrity - Adult  Goal: Skin integrity remains intact  Outcome: Progressing  Flowsheets (Taken 07/01/2021 2050)  Skin Integrity Remains Intact: Monitor for areas of redness and/or skin breakdown  Goal: Incisions, wounds, or drain sites healing without S/S of infection  Outcome: Progressing  Flowsheets (Taken 07/01/2021 2050)  Incisions, Wounds, or Drain Sites Healing Without Sign and Symptoms of Infection: ADMISSION and DAILY: Assess and document risk factors for pressure ulcer development     Problem: Musculoskeletal - Adult  Goal: Return mobility to safest level of function  Outcome: Progressing  Flowsheets (Taken 07/01/2021 2050)  Return Mobility to Safest Level of Function: Assess patient stability and activity tolerance for standing,  transferring and ambulating with or without assistive devices  Goal: Return ADL status to a safe level of function  Outcome: Progressing  Flowsheets (Taken 07/01/2021 2050)  Return ADL Status to a Safe Level of Function: Administer medication as ordered

## 2021-07-02 NOTE — Progress Notes (Signed)
ACUTE PHYSICAL THERAPY GOALS:   (Developed with and agreed upon by patient and/or caregiver.)  Pt will ambulate 500 ft under (S) without LOB or miss-steps and breaks as needed in 7 therapy sessions.  Pt will perform standing dynamic balance activities with minimal postural sway in 7 therapy sessions.  Pt will tolerate multiple sets and reps of BLE exercises in 7 therapy sessions.      PHYSICAL THERAPY Initial Assessment, Daily Note, and AM  (Link to Caseload Tracking: PT Visit Days : 1  Acknowledge Orders   Time In/Out   PT Charge Capture   Rehab Caseload Tracker    Debbie Gonzalez is a 66 y.o. female   PRIMARY DIAGNOSIS: Displaced fracture of lateral condyle of left humerus, initial encounter for closed fracture  Elbow fracture, left [S42.402A]  Closed dislocation of left elbow [S53.105A]  Displaced fracture of lateral condyle of left humerus, initial encounter for closed fracture [S42.452A]  Procedure(s) (LRB):  ORIF/REVISION LEFT ELBOW FX/DIS - Possible External Fixator (Left)  1 Day Post-Op  Reason for Referral: Difficulty in walking, Not elsewhere classified (R26.2)  Other abnormalities of gait and mobility (R26.89)  Inpatient: Payor: [employer]HC MEDICARE / Plan: [facility] AARP MEDICARE ADVANTAGE / Product Type: *No Product type* /     ASSESSMENT:     REHAB RECOMMENDATIONS:   Recommendation to date pending progress:  Setting:  Home Health Therapy  Vs No Needs until cleared for OP PT     Equipment:    None  To Be Determined     ASSESSMENT:  Debbie Gonzalez Is a 66 y.o. female presenting to PT POD 1 s/p L elbow ORIF and revision c Ex-Fix; NO ROM LUE. At time of initial evaluation, pt presents just slightly below baseline LOF with expected post operative deficits in bed mobility, strength/ ROM and transfers limiting her overall functional mobility. Today, pt performed all mobility Mod (I)/ SB(A), inc time and cueing including ambulation of 250'x1 without an AD. Of note, pt ambulating well for entire distance with baseline pattern,  other than lack of reciprocal arm swing, and did well to avoid any LOB/ miss-steps. Furthermore, pt performed all activity on RA and denied any dizziness, lightheadedness or SOB while moving about on their feet. At this time, pt is an appropriate candidate for skilled PT and will benefit from POC designed to address the aforementioned deficits. Upon completion of treatment, pt was positioned to comfort in bed with needs in reach. RN was made aware of pt performance.     DC Recommendation: HHPT vs No Needs until cleared for OP PT/OT     [employer] AM-PAC??? ???6 Clicks??? Basic Mobility Inpatient Short Form  AM-PAC Basic Mobility - Inpatient   How much help is needed turning from your back to your side while in a flat bed without using bedrails?: None  How much help is needed moving from lying on your back to sitting on the side of a flat bed without using bedrails?: None  How much help is needed moving to and from a bed to a chair?: None  How much help is needed standing up from a chair using your arms?: None  How much help is needed walking in hospital room?: A Little  How much help is needed climbing 3-5 steps with a railing?: A Little  AM-PAC Inpatient Mobility Raw Score : 22  AM-PAC Inpatient T-Scale Score : 53.28  Mobility Inpatient CMS 0-100% Score: 20.91  Mobility Inpatient CMS G-Code Modifier : CJ  SUBJECTIVE:   [name] states, "I'll be fine with my husband at home but thanks!"     Social/Functional      OBJECTIVE:     PAIN: VITALS / O2: PRECAUTION / LINES / DRAINS:   Pre Treatment: 4/10 LUE         Post Treatment: 4/10 LUE Vitals        Oxygen   RA   IV    RESTRICTIONS/PRECAUTIONS:           Other position/activity restrictions:  (NO ROM LUE; KEEP IN SLING)        GROSS EVALUATION: Intact Impaired (Comments):   AROM []   WNL other than expected LUE post op deficits    PROM []     Strength []   WNL other than expected LUE post op deficits    Balance [x]      Posture []  N/A   Sensation [x]      Coordination  [x]    WNL other than expected LUE post op deficits    Tone [x]      Edema []     Activity Tolerance [x]       []       COGNITION/  PERCEPTION: Intact Impaired (Comments):   Orientation [x]      Vision [x]   Not Formally Assessed but WFL   Hearing [x]   Not Formally Assessed but WFL   Cognition  [x]        MOBILITY: I Mod I S SBA CGA Min Mod Max Total  NT x2 Comments:   Bed Mobility    Rolling []  []  []  []  []  []  []  []  []  [x]  []     Supine to Sit []  [x]  []  []  []  []  []  []  []  []  []     Scooting []  [x]  []  []  []  []  []  []  []  []  []     Sit to Supine []  [x]  []  []  []  []  []  []  []  []  []     Transfers    Sit to Stand []  []  []  [x]  []  []  []  []  []  []  []     Bed to Chair []  []  []  []  []  []  []  []  []  [x]  []     Stand to Sit []  []  []  [x]  []  []  []  []  []  []  []      []  []  []  []  []  []  []  []  []  []  []     I=Independent, Mod I=Modified Independent, S=Supervision, SBA=Standby Assistance, CGA=Contact Guard Assistance,   Min=Minimal Assistance, Mod=Moderate Assistance, Max=Maximal Assistance, Total=Total Assistance, NT=Not Tested    GAIT: I Mod I S SBA CGA Min Mod Max Total  NT x2 Comments:   Level of Assistance []  []  []  [x]  []  []  []  []  []  []  []     Distance  250'x1    DME Gait Belt    Gait Quality WNL other than lack of reciprocal arm-swing    Weightbearing Status      Stairs      I=Independent, Mod I=Modified Independent, S=Supervision, SBA=Standby Assistance, CGA=Contact Guard Assistance,   Min=Minimal Assistance, Mod=Moderate Assistance, Max=Maximal Assistance, Total=Total Assistance, NT=Not Tested    PLAN:   FREQUENCY AND DURATION: 3 times/week (Pt moving without physical (A) and up ad-lib throughout the day already) for duration of hospital stay or until stated goals are met, whichever comes first.    THERAPY PROGNOSIS: Good    PROBLEM LIST:   (Skilled intervention is medically necessary to address:)  Decreased ADL/Functional Activities  Decreased AROM/PROM  Decreased Strength  Decreased Transfer Abilities  Increased Pain INTERVENTIONS PLANNED:   (Benefits  and  precautions of physical therapy have been discussed with the patient.)  Self Care Training  Therapeutic Activity  Therapeutic Exercise/HEP  Gait Training  Education       TREATMENT:   EVALUATION: LOW COMPLEXITY: (Untimed Charge)    TREATMENT:   Therapeutic Activity (12 Minutes): Therapeutic activity included Supine to Sit, Scooting, Lateral Scooting, Transfer Training, Ambulation on level ground, Sitting balance , and Standing balance to improve functional Activity tolerance, Balance, Mobility, Strength, and ROM.    TREATMENT GRID:  N/A    AFTER TREATMENT PRECAUTIONS: Bed, Bed/Chair Locked, Call light within reach, Needs within reach, and RN notified    INTERDISCIPLINARY COLLABORATION:  RN/ PCT and PT/ PTA    EDUCATION: Education Given To: Patient  Education Provided: Role of Therapy    TIME IN/OUT:  Time In: 0853  Time Out: 0912  Minutes: 19    Tawni Millers, PT

## 2021-07-03 MED FILL — ACETAMINOPHEN 325 MG PO TABS: 325 MG | ORAL | Qty: 2

## 2021-07-03 MED FILL — CEFAZOLIN SODIUM 1 G IJ SOLR: 1 g | INTRAMUSCULAR | Qty: 1000

## 2021-07-03 MED FILL — OXYCODONE HCL 15 MG PO TABS: 15 MG | ORAL | Qty: 1

## 2021-07-03 MED FILL — SERTRALINE HCL 50 MG PO TABS: 50 MG | ORAL | Qty: 1

## 2021-07-03 MED FILL — HYDROMORPHONE HCL 1 MG/ML IJ SOLN: 1 MG/ML | INTRAMUSCULAR | Qty: 1

## 2021-07-03 NOTE — Progress Notes (Signed)
[facility]        July 03, 2021         Post Op day: 2 Days Post-Op Procedure(s) (LRB):  ORIF/REVISION LEFT ELBOW FX/DIS - Possible External Fixator (Left)      Admit Date: 07/01/2021  Admit Diagnosis: Elbow fracture, left [S42.402A]  Closed dislocation of left elbow [S53.105A]  Displaced fracture of lateral condyle of left humerus, initial encounter for closed fracture [S42.452A]       Principle Problem: Displaced fracture of lateral condyle of left humerus, initial encounter for closed fracture.           Subjective: Doing well, No complaints, No SOB, No Chest Pain, No Nausea or Vomiting    Patient sitting up in bed  Pain well controlled - only tylenol through the night       Objective:   Vital Signs are Stable, No Acute Distress, Alert,  Dressing is Dry,  Neurovascular exam is normal.    Dressing clean/dry/intact  Swelling decreased     Assessment / Plan :  Patient Active Problem List   Diagnosis    Closed fracture of capitellum of distal humerus, left, initial encounter    Pneumothorax on left    Pulmonary nodule    Elbow fracture, left    Closed dislocation of left elbow    Displaced fracture of lateral condyle of left humerus, initial encounter for closed fracture    Patient Vitals for the past 8 hrs:   BP Temp Temp src Pulse Resp SpO2   07/03/21 0741 113/73 98.2 F (36.8 C) Oral 76 17 99 %   07/03/21 0345 129/71 98.8 F (37.1 C) Oral 73 16 97 %    Temp (24hrs), Avg:98.4 F (36.9 C), Min:97.7 F (36.5 C), Max:98.8 F (37.1 C)    Body mass index is 19.18 kg/m.    Lab Results   Component Value Date/Time    HGB 12.4 06/24/2021 08:39 AM          S/P Procedure(s) (LRB):  ORIF/REVISION LEFT ELBOW FX/DIS - Possible External Fixator (Left)      Medical Mgmt per hospitalist  Continue PT  Fall Precautions  DC disp: home today or tomorrow  F/U: 2 weeks postop for wound check and staple removal        Signed By: Ashley Mariner, APRN - CNP  07/03/2021,  9:34 AM

## 2021-07-03 NOTE — Plan of Care (Signed)
Problem: Pain  Goal: Verbalizes/displays adequate comfort level or baseline comfort level  Outcome: Progressing  Flowsheets (Taken 07/03/2021 2057)  Verbalizes/displays adequate comfort level or baseline comfort level: Encourage patient to monitor pain and request assistance     Problem: Safety - Adult  Goal: Free from fall injury  Outcome: Progressing  Flowsheets (Taken 07/03/2021 2057)  Free From Fall Injury: Instruct family/caregiver on patient safety     Problem: Discharge Planning  Goal: Discharge to home or other facility with appropriate resources  Outcome: Progressing     Problem: Neurosensory - Adult  Goal: Achieves maximal functionality and self care  Outcome: Progressing  Flowsheets (Taken 07/03/2021 2057)  Achieves maximal functionality and self care: Monitor swallowing and airway patency with patient fatigue and changes in neurological status     Problem: Skin/Tissue Integrity - Adult  Goal: Skin integrity remains intact  Outcome: Progressing  Flowsheets (Taken 07/03/2021 2057)  Skin Integrity Remains Intact: Monitor for areas of redness and/or skin breakdown  Goal: Incisions, wounds, or drain sites healing without S/S of infection  Outcome: Progressing  Flowsheets (Taken 07/03/2021 2057)  Incisions, Wounds, or Drain Sites Healing Without Sign and Symptoms of Infection: TWICE DAILY: Assess and document dressing/incision, wound bed, drain sites and surrounding tissue     Problem: Musculoskeletal - Adult  Goal: Return mobility to safest level of function  Outcome: Progressing  Goal: Return ADL status to a safe level of function  Outcome: Progressing

## 2021-07-04 MED ORDER — OXYCODONE-ACETAMINOPHEN 5-325 MG PO TABS
5-325 MG | ORAL_TABLET | ORAL | 0 refills | Status: AC
Start: 2021-07-04 — End: 2021-07-07

## 2021-07-04 MED FILL — OXYCODONE HCL 15 MG PO TABS: 15 MG | ORAL | Qty: 1

## 2021-07-04 MED FILL — SERTRALINE HCL 50 MG PO TABS: 50 MG | ORAL | Qty: 1

## 2021-07-04 NOTE — Discharge Instructions (Signed)
[facility]        Patient Discharge Instructions    Debbie Gonzalez / [id number] DOB: 21-May-1955    Admitted 07/01/2021 Discharged: 07/04/2021     IF YOU HAVE ANY PROBLEMS ONCE YOU ARE AT HOME CALL THE FOLLOWING NUMBERS:   Main office number: [phone] ask for Vernona Rieger (medical assistant with Dr. Zoe Lan)  Office Address: [address] [place], [place] [address]          Activity  Non-weight bearing left upper extremity  Keep dressing dry and clean        Diet  Resume regular diet       Medications    The medications you are to continue on are listed on the medication reconciliation sheet.   Narcotic pain medications as well as supplemental iron can cause constipation. If this occurs try stopping the narcotic pain medication and/or the iron.   It is important that you take the medication exactly as they are prescribed.  Keep your medication in the bottles provided by the pharmacist and keep a list of the medication names, dosages, and times to be taken in your wallet.   Do not take other medications without consulting your doctor.       Other Important Information    Do NOT smoke as this will greatly increase your risk of infection!    Do not drive and operate hazardous machinery until being cleared to do so by your doctor       You are at a risk for falls. Use caution when walking.      Patients should not drive if they are still taking narcotic pain mediation during the daytime hours. Most patients wean themselves off of pain medication within 2-5 weeks after surgery.     Please give a list of your current medications to your Primary Care Provider and update this list whenever your medications are discontinued, doses are changed, or new medications (including over-the-counter products) are added. Please carry medication information at all times in case of emergency situations.    If you have sleep apnea and have a CPAP machine, please use it for all naps and sleeping.          When to Call  the office    - If you have a temperature greater then 101  - Excessive bleeding that does not stop after holding pressure over the area  - Excessive redness, swelling or bruising, and/ or green or yellow, smelly discharge from incision  - Uncontrolled vomiting   - Loose control of your bladder or bowel function  - Need a pain medication refill   - Need to change your follow up appointment     Information obtained by :  I understand that if any problems occur once I am at home I am to contact my physician.    I understand and acknowledge receipt of the instructions indicated above.  Physician's or R.N.'s Signature                                                                  Date/Time                                                                                                                                              Patient or Garment/textile technologist                                                          Date/Time

## 2021-07-04 NOTE — Progress Notes (Signed)
ACUTE OCCUPATIONAL THERAPY GOALS:   (Developed with and agreed upon by patient and/or caregiver.)  No goals, evaluation and d/c. Pt is well equipped and knowledgeable about ADL adaptive strategies for LUE.    OCCUPATIONAL THERAPY Initial Assessment and Daily Note       OT Visit Days: 1  Acknowledge Orders  Time  OT Charge Capture  Rehab Caseload Tracker      Debbie Gonzalez is a 66 y.o. female   PRIMARY DIAGNOSIS: Displaced fracture of lateral condyle of left humerus, initial encounter for closed fracture  Elbow fracture, left [S42.402A]  Closed dislocation of left elbow [S53.105A]  Displaced fracture of lateral condyle of left humerus, initial encounter for closed fracture [S42.452A]  Procedure(s) (LRB):  ORIF/REVISION LEFT ELBOW FX/DIS - Possible External Fixator (Left)  3 Days Post-Op  Reason for Referral: Pain in Left Shoulder (M25.512)  Stiffness of Left Shoulder, Not elsewhere classified (M25.612)  Generalized Muscle Weakness (M62.81)  Inpatient: Payor: [employer] MEDICARE / Plan: [facility] AARP MEDICARE ADVANTAGE / Product Type: *No Product type* /     ASSESSMENT:     REHAB RECOMMENDATIONS:   Recommendation to date pending progress:  Setting:  No further skilled therapy after discharge from hospital    Equipment:    None--pt has SC at home     ASSESSMENT:  Debbie Gonzalez is a 66 y/o female presents with ORIF/revision of left elbow with external fixator placement. Pt is now NWB, no ROM to LUE, LUE in sling. At baseline pt lives with her husband, is independent all ADLs, is very active and drives. Today pt overall independent no AD for functional transfers and mobility of household distances. Pt able to complete toilet transfer and LE dressing independently. When attempting to educate pt regarding ADL adaptive strategies for LUE, pt was able to recall all strategies from previous surgery with 100% accuracy. Due to this, pt does not have further skilled OT needs as pt is up ad lib in room. Pt appears to be functioning at  her baseline and does not need further OT services during acute care stay or at d/c. Will d/c from caseload.      [facility]	[place] AM-PACT "6 Clicks" Daily Activity Inpatient Short Form:    AM-PAC Daily Activity - Inpatient   How much help is needed for putting on and taking off regular lower body clothing?: None  How much help is needed for bathing (which includes washing, rinsing, drying)?: None  How much help is needed for toileting (which includes using toilet, bedpan, or urinal)?: None  How much help is needed for putting on and taking off regular upper body clothing?: None  How much help is needed for taking care of personal grooming?: None  How much help for eating meals?: None  AM-PAC Inpatient Daily Activity Raw Score: 24  AM-PAC Inpatient ADL T-Scale Score : 57.54  ADL Inpatient CMS 0-100% Score: 0  ADL Inpatient CMS G-Code Modifier : CH           SUBJECTIVE:     Debbie Gonzalez states, "I normally work out for 2 hours per day"     Social/Functional Lives With: Spouse  Type of Home: Condo  Home Layout: One level  Home Access: Marketing executive Shower/Tub: Lawyer: Civil engineer, contracting  ADL Assistance: Independent  Homemaking Assistance: Independent  Ambulation Assistance: Independent  Transfer Assistance: Independent  Mode of Transportation: Car  Occupation: Retired    OBJECTIVE:     LINES / DRAINS /  AIRWAY: None    RESTRICTIONS/PRECAUTIONS:  Position Activity Restriction  Other position/activity restrictions:  (NO ROM LUE; KEEP IN SLING)    PAIN: VITALS / O2:   Pre Treatment:   Pain Assessment: None - Denies Pain      Post Treatment: no c/o pain, resting comfortably in supine       Vitals          Oxygen            GROSS EVALUATION: INTACT IMPAIRED   (See Comments)   UE AROM '[]'  '[]' RUE WFL, LUE in sling, no ROM per ortho   UE PROM '[]'  '[]' RUE WFL, LUE in sling, no ROM per ortho   Strength '[]'   RUE WFL, LUE in sling     Posture / Balance '[x]'      Sensation '[x]'      Coordination '[x]'        Tone '[x]'         Edema '[x]'     Activity Tolerance '[x]'        Hand Dominance R '[x]'  L '[]'       COGNITION/  PERCEPTION: INTACT IMPAIRED   (See Comments)   Orientation '[x]'      Vision '[x]'   Wears glasses   Hearing '[x]'      Cognition  '[x]'      Perception '[x]'        MOBILITY: I Mod I S SBA CGA Min Mod Max Total  NT x2 Comments:   Bed Mobility    Rolling '[]'  '[]'  '[]'  '[]'  '[]'  '[]'  '[]'  '[]'  '[]'  '[x]'  '[]'     Supine to Sit '[x]'  '[]'  '[]'  '[]'  '[]'  '[]'  '[]'  '[]'  '[]'  '[]'  '[]'     Scooting '[x]'  '[]'  '[]'  '[]'  '[]'  '[]'  '[]'  '[]'  '[]'  '[]'  '[]'     Sit to Supine '[x]'  '[]'  '[]'  '[]'  '[]'  '[]'  '[]'  '[]'  '[]'  '[]'  '[]'     Transfers    Sit to Stand '[x]'  '[]'  '[]'  '[]'  '[]'  '[]'  '[]'  '[]'  '[]'  '[]'  '[]'  No AD   Bed to Chair '[x]'  '[]'  '[]'  '[]'  '[]'  '[]'  '[]'  '[]'  '[]'  '[]'  '[]'  No AD   Stand to Sit '[x]'  '[]'  '[]'  '[]'  '[]'  '[]'  '[]'  '[]'  '[]'  '[]'  '[]'  No AD   Tub/Shower '[]'  '[]'  '[]'  '[]'  '[]'  '[]'  '[]'  '[]'  '[]'  '[x]'  '[]'      Toilet '[x]'  '[]'  '[]'  '[]'  '[]'  '[]'  '[]'  '[]'  '[]'  '[]'  '[]'   No AD to standard toilet    '[]'  '[]'  '[]'  '[]'  '[]'  '[]'  '[]'  '[]'  '[]'  '[x]'  '[]'     I=Independent, Mod I=Modified Independent, S=Supervision/Setup, SBA=Standby Assistance, CGA=Contact Guard Assistance, Min=Minimal Assistance, Mod=Moderate Assistance, Max=Maximal Assistance, Total=Total Assistance, NT=Not Tested    ACTIVITIES OF DAILY LIVING: I Mod I S SBA CGA Min Mod Max Total NT Comments: pt reports independence with ADLs, up ad lib in room    BASIC ADLs:              Upper Body Bathing  '[]'  '[]'  '[]'  '[]'  '[]'  '[]'  '[]'  '[]'  '[]'  '[x]'     Lower Body Bathing '[]'  '[]'  '[]'  '[]'  '[]'  '[]'  '[]'  '[]'  '[]'  '[x]'     Toileting '[]'  '[]'  '[]'  '[]'  '[]'  '[]'  '[]'  '[]'  '[]'  '[x]'     Upper Body Dressing '[]'  '[]'  '[]'  '[]'  '[]'  '[]'  '[]'  '[]'  '[]'  '[x]'     Lower Body Dressing '[x]'  '[]'  '[]'  '[]'  '[]'  '[]'  '[]'  '[]'  '[]'  '[]'  Donning underwear sitting/standing from EOB   Feeding '[]'  '[]'  '[]'  '[]'  '[]'  '[]'  '[]'  '[]'  '[]'  '[x]'     Grooming '[]'  '[]'  '[]'  '[]'  '[]'  '[]'  '[]'  '[]'  '[]'  [  x]    Personal Device Care '[]'  '[]'  '[]'  '[]'  '[]'  '[]'  '[]'  '[]'  '[]'  '[x]'     Functional Mobility '[x]'  '[]'  '[]'  '[]'  '[]'  '[]'  '[]'  '[]'  '[]'  '[]'  No AD   I=Independent, Mod I=Modified Independent, S=Supervision/Setup, SBA=Standby Assistance, CGA=Contact Guard Assistance, Min=Minimal Assistance, Mod=Moderate Assistance, Max=Maximal Assistance, Total=Total  Assistance, NT=Not Tested    PLAN:   FREQUENCY/DURATION   OT Plan of Care:  (eval & d/c) for duration of hospital stay or until stated goals are met, whichever comes first.    PROBLEM LIST:   (Skilled intervention is medically necessary to address:)  N/A   INTERVENTIONS PLANNED:  (Benefits and precautions of occupational therapy have been discussed with the patient.)  N/A         TREATMENT:     EVALUATION: LOW COMPLEXITY: (Untimed Charge)    TREATMENT:   Self Care (8 minutes): Patient participated in toileting and lower body dressing ADLs in unsupported sitting and standing with no   cueing to increase independence and decrease assistance required. Patient also participated in functional mobility, functional transfer, and adaptive strategies for ADLs with ROM restrictions LUE training to increase independence, decrease assistance required, increase activity tolerance, increase safety awareness, and maintain precautions.     TREATMENT GRID:  N/A    AFTER TREATMENT PRECAUTIONS: Bed, Call light within reach, Needs within reach, and RN notified    INTERDISCIPLINARY COLLABORATION:  RN/ PCT and OT/ COTA    EDUCATION:  Education Given To: Patient  Education Provided: Role of Therapy;Plan of Care;ADL Adaptive Strategies  Education Method: Verbal  Barriers to Learning: None  Education Outcome: Verbalized understanding    TOTAL TREATMENT DURATION AND TIME:  Time In: 0900  Time Out: 0916  Minutes: [place], OT

## 2021-07-04 NOTE — Progress Notes (Signed)
The pt ready for d/c home with family care. D/C instructions and  scripts info provided by primary RN. The pt is waiting for the ride. Marland Kitchen

## 2021-07-04 NOTE — Telephone Encounter (Signed)
Spoke to patient and she does not know anything about calling about inhaler becausec she was in surgery Friday and says to disregard the message.

## 2021-07-04 NOTE — Discharge Summary (Signed)
[facility]   Discharge Summary         Patient ID:  Debbie Gonzalez  [id number]  66 y.o.  Jul 08, 1955    Admit date: 07/01/2021  Discharge date and time:    Admitting Physician: Trudi Ida, MD  Surgeon: Same    Hospital Course    Admission Diagnoses: Pre op diagnosis: Elbow fracture, left [S42.402A]  Closed dislocation of left elbow [S53.105A]  Prior to surgery the patient was seen for consultation in the office or hospital and a complete history and physical was taken as it pertained to their condition.   Discharge Diagnoses: Displaced fracture of lateral condyle of left humerus, initial encounter for closed fracture. Chronic and Acute medical problems addressed during this hospital stay by consulting physicians include:   They underwent Procedure(s) (LRB):  ORIF/REVISION LEFT ELBOW FX/DIS - Possible External Fixator (Left) for this.       Principle Problem: Displaced fracture of lateral condyle of left humerus, initial encounter for closed fracture.     Other Chronic and Acute Medical Issues: managed by the hospitalist during admission included: Principal Problem:    Displaced fracture of lateral condyle of left humerus, initial encounter for closed fracture  Active Problems:    Elbow fracture, left    Closed dislocation of left elbow  Resolved Problems:    * No resolved hospital problems. *                               Perioperative Antibiotics:  Prior to surgery Ancef 1 to 2 mg was given depending on patient's weight and allergies.  If the patient was allergic to Ancef or MRSA positive  the patient was given Vancomycin and Cleocin        Hospital Medications:   Current Facility-Administered Medications   Medication Dose Route Frequency    HYDROmorphone HCl PF (DILAUDID) injection 1 mg  1 mg IntraVENous Q3H PRN    acetaminophen (TYLENOL) tablet 650 mg  650 mg Oral Q6H PRN    sodium chloride flush 0.9 % injection 5-40 mL  5-40 mL IntraVENous 2 times per day    sodium chloride flush 0.9 %  injection 5-40 mL  5-40 mL IntraVENous PRN    0.9 % sodium chloride infusion   IntraVENous PRN    ondansetron (ZOFRAN-ODT) disintegrating tablet 4 mg  4 mg Oral Q8H PRN    Or    ondansetron (ZOFRAN) injection 4 mg  4 mg IntraVENous Q6H PRN    polyethylene glycol (GLYCOLAX) packet 17 g  17 g Oral Daily PRN    oxyCODONE (OXY-IR) immediate release tablet 15 mg  15 mg Oral Q3H PRN    HYDROmorphone HCl PF (DILAUDID) injection 0.5 mg  0.5 mg IntraVENous Q1H PRN    HYDROmorphone (DILAUDID) injection 0.5 mg  0.5 mg IntraVENous PRN    sertraline (ZOLOFT) tablet 50 mg  50 mg Oral Nightly    medicated lip ointment (BLISTEX)   Topical PRN         Additional DVT Prophylaxis:  TED Hose, SCDs     Postoperative Blood Report: If the patient received blood products during their admission they are listed below:     No components found for: PCTEXX  No results found for: PCTABR  Lab Results   Component Value Date/Time    ABORH A POSITIVE 06/24/2021 08:39 AM     No components found for: PCTUN  No components found  for: PCTCT  No components found for: PCTUDIV  No components found for: PCTXM    Post Op complications: none       Physical Therapy: PT was started on the day of surgery and progressed.   PT/OT:                             Disposition: Good    Upon Discharge:The wound appears to be healing without any evidence of infection.      Pt Discharged to: [facility]    Discharge Medications:      Medication List        CONTINUE taking these medications      docusate sodium 100 MG capsule  Commonly known as: COLACE     estradiol 0.1 MG/GM vaginal cream  Commonly known as: ESTRACE     ferrous sulfate 325 (65 Fe) MG tablet  Commonly known as: IRON 325     ibuprofen 200 MG tablet  Commonly known as: ADVIL;MOTRIN     MULTIVITAMIN ADULT PO     oxyCODONE-acetaminophen 5-325 MG per tablet  Commonly known as: PERCOCET  Take 1 tablet by mouth every 4 hours for 3 days. Max Daily Amount: 6 tablets     PRESERVISION AREDS 2+MULTI VIT PO     sertraline 100 MG  tablet  Commonly known as: ZOLOFT     VIACTIV PO     VITAMIN D PO            STOP taking these medications      ALPRAZolam 1 MG tablet  Commonly known as: XANAX     VITAMIN E PO               Where to Get Your Medications        These medications were sent to [id number] [place] - [address] - P [phone] Carmon Ginsberg [phone]  [address] [place] [address]      Phone: [phone]   oxyCODONE-acetaminophen 5-325 MG per tablet           Discharge instructions:  - Refer to the Medication Reconciliation sheet for all discharge medications   -Rx pain medication given   -Resume pre hospital diet              -Ambulate with walker and fall precautions   -Follow up in office as scheduled       Signed:  Deberah Castle, PA, PA  07/04/2021  9:29 AM

## 2021-07-04 NOTE — Telephone Encounter (Signed)
Patient spouse states that Dr. Waynette Buttery took tube out last Thursday and he wants to know how long the bandage needs to stay on. Patient is currently admitted.   He asked for a call back - he can be reached at [phone].  Alinda Money).

## 2021-07-04 NOTE — Telephone Encounter (Signed)
Advised spouse that msg can be sent to rounding providers at hospital.  [facility] @ floor and gave verbal order from NP (Coutu) that patient can remove drsg and shower. She will relay.  Advised spouse, encouraged to have drsg removed while at hospital.

## 2021-07-04 NOTE — Care Coordination-Inpatient (Signed)
Patient medically ready to discharge per attending.  CM reviewed chart for potential discharge needs.  Previous CM documentation reviewed.  Therapy recommending no needs at discharge.  No discharge needs noted for CM to assist with at this time.

## 2021-07-04 NOTE — Progress Notes (Signed)
Physical Therapy Note:    Attempted to see patient this AM for physical therapy treatment  session. Patient politely declining as she reports that she cont to ambulate about the room and hallway ad-lib without . Discharging from acute PT caseload; pt informed and agreeable. She will likely need OP PT when cleared to participate. Thank you,    Tawni Millers, PT     Rehab Caseload Tracker

## 2021-07-05 ENCOUNTER — Telehealth

## 2021-07-05 NOTE — Telephone Encounter (Signed)
Patient was discharged on 2/20.  Dr. Waynette Buttery want CT scan done before the Hospital follow up

## 2021-07-05 NOTE — Telephone Encounter (Signed)
CT order has been established.  I spoke with Ethelene Browns (patient's spouse) and notified him that Dr. Waynette Buttery wanted the patient to get a CT scan prior to her appointment with Dr. Dayton Martes.  Ethelene Browns understood and will notify the patient.  Number for radiology scheduling has been provided and there was no further questions or concerns at this time. // Dorris Carnes CCMA

## 2021-07-07 ENCOUNTER — Encounter: Payer: MEDICARE | Attending: Orthopaedic Surgery | Primary: Internal Medicine

## 2021-07-07 ENCOUNTER — Ambulatory Visit: Admit: 2021-07-07 | Discharge: 2021-07-07 | Payer: MEDICARE | Primary: Internal Medicine

## 2021-07-07 ENCOUNTER — Ambulatory Visit
Admit: 2021-07-07 | Discharge: 2021-07-07 | Payer: MEDICARE | Attending: Orthopaedic Surgery | Primary: Internal Medicine

## 2021-07-07 DIAGNOSIS — S42402A Unspecified fracture of lower end of left humerus, initial encounter for closed fracture: Secondary | ICD-10-CM

## 2021-07-07 MED ORDER — OXYCODONE-ACETAMINOPHEN 5-325 MG PO TABS
5-325 MG | ORAL_TABLET | ORAL | 0 refills | Status: AC | PRN
Start: 2021-07-07 — End: 2021-07-12

## 2021-07-07 MED ORDER — CEPHALEXIN 500 MG PO CAPS
500 MG | ORAL_CAPSULE | Freq: Two times a day (BID) | ORAL | 0 refills | Status: DC
Start: 2021-07-07 — End: 2021-07-14

## 2021-07-07 MED ORDER — PREGABALIN 75 MG PO CAPS
75 MG | ORAL_CAPSULE | Freq: Two times a day (BID) | ORAL | 1 refills | Status: DC
Start: 2021-07-07 — End: 2021-07-14

## 2021-07-07 NOTE — Progress Notes (Signed)
Progress Note    Patient: Debbie Gonzalez MRN: [id number]  SSN: [id number]    Date of Birth: [birth year]  Age: 66 y.o.  Sex: female        07/07/2021      Subjective:     Patient is here approximately 1 week out from revision fixation of a left distal humerus fracture.  She had a rather comminuted intra-articular distal humerus fracture that was treated with open reduction internal fixation and then she had an olecranon osteotomy.  The olecranon osteotomy basically fell apart as I try to fix this at the end of the case and eventually led to me treating this with plate and screw fixation.  Unfortunately at her 1 week postop visit the fixation of the osteotomy had failed and she appeared to have some residual subluxation of the ulnohumeral joint as well so I took her back to the operating room revise the fixation of the olecranon osteotomy and placed her in a hinged elbow external fixator to try to make sure that her ulnohumeral joint was well reduced.  So now she is 1 week out from this injury.  She is complaining of some numbness in the ulnar nerve distribution.  She is also just having a lot of burning and tingling sensations in her left upper extremity    Objective:     There were no vitals filed for this visit.       Physical Exam:     Skin - incision is well healed with no redness or drainage  Motor and sensory function intact in LEFT UPPER extremity  Pulses palpable in LEFT UPPER extremity     XRAY FINDINGS:  Indications-follow-up left distal humerus fracture and elbow dislocation, findings-AP and lateral views of the left elbow shows that the fixation of the distal humerus appears to be intact.  The fixation of the olecranon appears to be intact and does appear that the ulnohumeral joint is reduced and similar in appearance to the immediate postoperative films.  Impression-well aligned left distal humerus fracture as well as intact olecranon osteotomy fixation and the ulnohumeral joint appears to be  intact    Assessment:     Week out from complex left elbow fracture dislocation    Plan:     I am very pleased the way her incisions look today with her pin sites and does appear that her x-rays show everything is lined up.  I think we can just continue to watch this closely so I will see her back in 1 week with AP and lateral left elbow and hopefully get her sutures out at that time.  I am also going to try giving her some Lyrica to see if this helps with some of her neuropathic pain.  It does appear that she has intact motor function with her ulnar nerve but she does still have significant symptoms of numbness with the ulnar nerve.  I have just encouraged her that I think this is likely a result of the fact that she has had multiple surgeries and so much swelling in her left arm so we will continue to watch this and obviously at the appropriate time if she does not have any return of function we may have to order some sort of additional testing but I think observation for now would be the most reasonable choice.  So I will see her back in 1 week and get AP and lateral x-rays of the left elbow on return she will be  about 2 weeks out at that time    Signed By: Trudi Ida, MD     July 07, 2021

## 2021-07-11 ENCOUNTER — Inpatient Hospital Stay: Admit: 2021-07-11 | Payer: MEDICARE | Primary: Internal Medicine

## 2021-07-11 DIAGNOSIS — J939 Pneumothorax, unspecified: Secondary | ICD-10-CM

## 2021-07-14 ENCOUNTER — Encounter: Payer: MEDICARE | Attending: Orthopaedic Surgery | Primary: Internal Medicine

## 2021-07-14 ENCOUNTER — Ambulatory Visit
Admit: 2021-07-14 | Discharge: 2021-07-14 | Payer: MEDICARE | Attending: Critical Care Medicine | Primary: Internal Medicine

## 2021-07-14 ENCOUNTER — Ambulatory Visit: Admit: 2021-07-14 | Discharge: 2021-07-14 | Payer: MEDICARE | Primary: Internal Medicine

## 2021-07-14 ENCOUNTER — Ambulatory Visit
Admit: 2021-07-14 | Discharge: 2021-07-14 | Payer: MEDICARE | Attending: Orthopaedic Surgery | Primary: Internal Medicine

## 2021-07-14 DIAGNOSIS — S42402A Unspecified fracture of lower end of left humerus, initial encounter for closed fracture: Secondary | ICD-10-CM

## 2021-07-14 DIAGNOSIS — J939 Pneumothorax, unspecified: Secondary | ICD-10-CM

## 2021-07-14 MED ORDER — AMITRIPTYLINE HCL 50 MG PO TABS
50 MG | ORAL_TABLET | Freq: Every evening | ORAL | 0 refills | Status: DC
Start: 2021-07-14 — End: 2021-08-02

## 2021-07-14 MED ORDER — OXYCODONE-ACETAMINOPHEN 5-325 MG PO TABS
5-325 MG | ORAL_TABLET | Freq: Four times a day (QID) | ORAL | 0 refills | Status: AC | PRN
Start: 2021-07-14 — End: 2021-07-19

## 2021-07-14 NOTE — Progress Notes (Signed)
Progress Note    Patient: Debbie Gonzalez MRN: [id number]  SSN: [id number]    Date of Birth: 1955-07-30  Age: 66 y.o.  Sex: female        07/14/2021      Subjective:     Patient is now about 2 weeks out from her revision fixation of her left distal humerus and olecranon osteotomy and about 3+ weeks out from her original surgery.  She states that she is doing pretty good the thing that is really bothering her the most though is her ulnar nerve symptoms.  These are still is giving her a lot of discomfort especially at night.  She thinks the Lyrica may have helped some but she is little bit nervous about taking it because of some of the side effects.    Objective:     There were no vitals filed for this visit.       Physical Exam:     Skin - incision is well healed with no redness or drainage  Motor and sensory function intact in LEFT UPPER extremity  Pulses palpable in LEFT UPPER extremity     XRAY FINDINGS:  Indications-follow-up left distal humerus fracture, findings-AP and lateral views of the left elbow shows the left distal humerus fracture appears to be reasonably well reduced.  Hardware is intact.  The overall reduction appears to be adequate.  The ulnohumeral joint itself as well as the radiocapitellar joint appear to be well reduced especially on the lateral projection.  We have a very good lateral x-ray today.  Patient-healing well aligned left distal humerus fractures olecranon osteotomy    Assessment:     Healing left distal humerus and olecranon osteotomy    Plan:     I think Minna try switching her from the Lyrica to some Elavil and see if this helps more at night.  She can take the oxycodone along with this.  We have also just encouraged her to try to take some ibuprofen during the day to see if we can start weaning her off the oxycodone.  She seems to be comfortable with this plan I will see her back in 1 week with repeat x-rays and likely will take the static portion of her external fixator  off at that time.  Just encouraged her to try to take the Elavil instead of the Lyrica for a few days and see if this helps some more.  She will be about 3 weeks from her surgical invention.        I have reviewed the patient's history of controlled substance prescriptions in the [facility] prescription drug monitoring program.  Signed By: Trudi Ida, MD     July 14, 2021

## 2021-07-14 NOTE — Progress Notes (Signed)
Name:  Debbie Gonzalez  Date of Birth:  [birth year]   MRN: [id number]      Office Visit: 07/14/2021        ASSESSMENT AND PLAN:  (Medical Decision Making)    Impression:  66 y.o. female presents with post procedural PTX and instantly found to have a left lower lobe pulmonary nodule    1. Pneumothorax on left  Completely resolved.  No underlying structural lung disease.  No history of obstructive lung disease.  No wheezing on exam.  She does note still feels like it is hard to get a deep breath, but she has had numerous issues recently and still recovering from elbow surgery.  I recommend that she recover from all of her issues, increase her exercise and when she returns following her follow-up CT scan if she still has any respiratory symptoms we can pursue complete PFTs for further evaluation.    2. Pulmonary nodule  Left lower lobe 6 mm pulmonary nodule.  She is a low risk patient, but given the borderline size I recommended repeat in 6 months and follow-up at that time.  Ultimately, I think that this is already improving in size and was likely a focal 1 for amatory nodule following her surgery and pneumothorax.  I do not think that this nodule on the CT scan likely was able to be seen on the chest x-ray and thus I believe this has improved in size.  - CT CHEST WO CONTRAST; Future  No orders of the defined types were placed in this encounter.    No orders of the defined types were placed in this encounter.    Follow-up and Dispositions    Return in about 6 months (around 01/14/2022) for CT Prior to Appointment, With Saxon Crosby.       Floreen Comber, MD    No specialty comments available.  _________________________________________________________________________    HISTORY OF PRESENT ILLNESS:    [name] is a 66 y.o. female who is seen at [facility] today for  Follow-Up from Hospital    66 y.o. female presents with post procedural PTX. She came in for elective L elbow surgery and had a L brachial block that was  complicated by a large L PTX. Dr. Vivi Ferns placed a Uresil in left chest 2/10 evening with improvement and was able to be discharged. She has felt okay at home, still sore with left arm. Some shortness of breath at times, but not worsening.  She returned on the 13th for possible UreSil removal, but there is still appear to be air leak by the diaphragm.  She returned again on the 16th and had her UreSil removed and is done well.  On that chest x-ray post removal she had a small left lower lobe pulmonary nodule and was set up for CT scan after this follow-up.  She denies any history of pulmonary issues.  She does still feel like it is a little bit hard to take a deep breath, but otherwise she is feeling well.    REVIEW OF SYSTEMS: 10 point review of systems is negative except as reported in HPI.    PHYSICAL EXAM: Body mass index is 18.43 kg/m.  Vitals:    07/14/21 1610   BP: 97/60   Pulse: 52   Resp: 20   Temp: 98 F (36.7 C)   TempSrc: Temporal   SpO2: 97%   Weight: 123 lb (55.8 kg)   Height: 5' 8.5" (1.74 m)  General:   Alert, cooperative, no distress, appears stated age.        Eyes:   Conjunctivae/corneas clear. PERRL        Mouth/Throat:  Lips, mucosa, and tongue normal. Teeth and gums normal.        Lungs:   Clear breath sounds bilaterally, no wheeze or rails     Heart:   Regular rate and rhythm, S1, S2 normal, no murmur, click, rub or gallop.     Abdomen:    Soft, non-tender.     Extremities:  Extremities normal, atraumatic, no cyanosis or edema.     Skin:  Skin color normal. No rashes or lesions     Neurologic:  A&Ox3     DIAGNOSTIC TESTS:                                                                                    LABS:   Lab Results   Component Value Date/Time    HGB 12.4 06/24/2021 08:39 AM     Imaging: I performed an independent interpretation of the patient's images.  CXR:     XR ELBOW LEFT (2 VIEWS) 07/14/2021    Narrative  AUTOMATIC ADMINISTRATIVE RESULT    The result for this exam can be  found in the Progress note in the chart.    Impression  See Progress note in the chart.    CT Chest: 65mm LLL pulmonary nodule, otherwise normal lungs    CT CHEST WO CONTRAST 07/11/2021    Narrative  CT CHEST without CONTRAST.    INDICATION: Postop pneumothorax, status post chest tube removal    COMPARISON: Chest x-ray 10 days prior    TECHNIQUE:   5 mm axial scans from the apices through the diaphragms without  contrast. Radiation dose reduction techniques were used for this study.  Our CT  scanners use one or more of the following:  Automated exposure control,  adjustment of the mA and or kV according to patient size, iterative  reconstruction.    FINDINGS:  LUNGS:  A 4 x 7 mm subsolid nodule left lower lobe axial image #52.  Otherwise lungs are clear.  No pneumothorax.  Thin linear scar or atelectasis posterior right lung base.    AIRWAYS: Trachea and proximal bronchi grossly patent.  PLEURA: No effusion or thickening or calcifications.  LYMPH NODES: No enlarged axillary, hilar or mediastinal lymph nodes.  HEART: Normal size.  AORTA: Normal caliber.  UPPER ABDOMEN: Normal size adrenal glands.  SKELETAL/CHEST WALL: DJD, otherwise no gross bony lesions.  589 Study marked for followup.    Impression  A solitary 6 mm average diameter some subsolid nodule left lower  lobe which corresponds to the chest x-ray. Recommend repeat CT in one year for  follow-up.    Nuclear Medicine: No results found for this or any previous visit from the past 3650 days.    PFTs:   No flowsheet data found.  No results found for this or any previous visit. No results found for this or any previous visit.  FeNO: No results found for this or any previous visit.  FeNO and Likelihood of Eosinophilic Asthma   Unlikely  Intermediate Likely   <25 ppb 25-50 ppb >50ppb   Exercise Oximetry:  Echo: No results found for this or any previous visit from the past 3650 days.    PMH Reference Info:                                                                                                                 Past Medical History:   Diagnosis Date    Anxiety     Closed fracture of capitellum of distal humerus, left, initial encounter 06/24/2021    Dr. Zoe Lan    Complication of neuromuscular block     patient states "chest tube placed after left elbow surgery because of punctured lung from block they gave me"    Hx of chest tube placement     chest tube removed 06/30/2021    Illness, unspecified     patient had scopalamine patch placed prior to ORIF of left elbow but does not have hx of N&V w/ anesthesia        Tobacco Use      Smoking status: Never      Smokeless tobacco: Never    No Known Allergies  Current Outpatient Medications   Medication Instructions    amitriptyline (ELAVIL) 50 mg, Oral, NIGHTLY    Calcium-Vitamin D-Vitamin K (VIACTIV PO) Oral, EVERY BEDTIME    docusate sodium (COLACE) 100 mg, Oral, AS NEEDED    ferrous sulfate (IRON 325) 325 mg, Oral, EVERY BEDTIME    ibuprofen (ADVIL;MOTRIN) 600 mg, Oral, EVERY 6 HOURS PRN    Multiple Vitamin (MULTIVITAMIN ADULT PO) Oral, EVERY BEDTIME    Multiple Vitamins-Minerals (PRESERVISION AREDS 2+MULTI VIT PO) 1 tablet, Oral, 2 times daily    oxyCODONE-acetaminophen (PERCOCET) 5-325 MG per tablet 1 tablet, Oral, EVERY 6 HOURS PRN, Intended supply: 5 days. Take lowest dose possible to manage pain    sertraline (ZOLOFT) 100 mg, Oral, DAILY    VITAMIN D PO Oral, EVERY BEDTIME

## 2021-07-20 ENCOUNTER — Encounter: Payer: MEDICARE | Attending: Nurse Practitioner | Primary: Internal Medicine

## 2021-07-21 ENCOUNTER — Ambulatory Visit: Admit: 2021-07-21 | Discharge: 2021-07-21 | Payer: MEDICARE | Primary: Internal Medicine

## 2021-07-21 ENCOUNTER — Ambulatory Visit
Admit: 2021-07-21 | Discharge: 2021-07-21 | Payer: MEDICARE | Attending: Orthopaedic Surgery | Primary: Internal Medicine

## 2021-07-21 ENCOUNTER — Encounter: Payer: MEDICARE | Attending: Orthopaedic Surgery | Primary: Internal Medicine

## 2021-07-21 DIAGNOSIS — S42402A Unspecified fracture of lower end of left humerus, initial encounter for closed fracture: Secondary | ICD-10-CM

## 2021-07-21 NOTE — Progress Notes (Signed)
Progress Note    Patient: Debbie Gonzalez MRN: [id number]  SSN: [id number]    Date of Birth: 31-Mar-1956  Age: 66 y.o.  Sex: female        07/21/2021      Subjective:     Patient is now about 3 weeks out from her revision fixation of her left distal humerus fracture and her olecranon osteotomy which is failed his first attempt of fixation.  She has been in a static external fixator.  She says she feels much better.  Her main complaint is still numbness in an ulnar nerve distribution other than that she is doing pretty well.    Objective:     There were no vitals filed for this visit.       Physical Exam:     Skin - incision is well healed with no redness or drainage  Motor and sensory function intact in LEFT UPPER extremity with the exception of the fact that she still has some significant numbness in ulnar nerve distribution.  She appears to have intact motor function in her ulnar nerve with her first dorsal interosseous of apparently functioning well to my exam today  Pulses palpable in LEFT UPPER extremity     XRAY FINDINGS:  Occasions-follow-up left distal humerus fracture and olecranon osteotomy, findings-true AP and lateral views of the elbow shows that the ulnohumeral and radiocapitellar joints appear to be well reduced.  The overall reduction of the distal humerus fracture as well as the olecranon osteotomy appears to be reasonable.  Next well aligned left distal humerus fracture and olecranon osteotomy    Assessment:     3 weeks out from revision fixation of left distal humerus fracture with olecranon osteotomy elbow dislocation    Plan:     I am very pleased the way she looks today.  I Minna remove the static portion of external fixator and allow her to start doing some early range of motion.  I want her to be very calm and disorder do this on her own.  I will see her back in 2 weeks with AP and lateral left elbow on return she will be about 5 weeks out at that time.  I do think we will go ahead  and try to get her scheduled for a EMG nerve conduction study just to get a better feel of what is going on with her ulnar nerve as well.    Signed By: Trudi Ida, MD     July 21, 2021

## 2021-08-02 ENCOUNTER — Ambulatory Visit: Admit: 2021-08-02 | Discharge: 2021-08-02 | Payer: MEDICARE | Primary: Internal Medicine

## 2021-08-02 ENCOUNTER — Ambulatory Visit
Admit: 2021-08-02 | Discharge: 2021-08-02 | Payer: MEDICARE | Attending: Orthopaedic Surgery | Primary: Internal Medicine

## 2021-08-02 DIAGNOSIS — S42402A Unspecified fracture of lower end of left humerus, initial encounter for closed fracture: Secondary | ICD-10-CM

## 2021-08-02 NOTE — Other (Signed)
Phone pre-assessment completed.    Verified name&  DOB. Order to obtain consent found in EHR &  matches case posting.    Type 1B surgery,  assessment complete.  Orders not received.    Labs per surgeon: none  Labs per anesthesia protocol: none      Medical/surgical history questions answered at their best of ability. All prior to admission medications reviewed and documented in [facility].    Instructed to take ONLY THE FOLLOWING MEDICATIONS ON THE DAY OF SURGERY according to anesthesia guidelines with sips of water: sertraline .      VERBALIZES UNDERSTANDING TO HOLD ALL VITAMINS AND SUPPLEMENTS  and NSAIDS (ibuprofen, aspirin, naproxen) IMMEDIATELY PER ANESTHESIA PROTOCOL.    Instructed on the following:    > Arrive at [facility], time of arrival to be called the day before by 1700  > NPO after midnight including gum, mints, and ice chips  > Responsible adult must drive patient to the hospital, stay during surgery, and patient will need supervision 24 hours after anesthesia  > Use antibacterial soap in shower the night before surgery and on the morning of surgery  > All piercings must be removed prior to arrival.    > Leave all valuables (money and jewelry) at home but bring insurance card and ID on DOS.   > You may be required to pay a deductible or co-pay on the day of your procedure. You can pre-pay by calling [phone] if your surgery is at the [facility] or [phone] if your surgery is at the [facility].  > Do not wear make-up, nail polish, lotions, cologne, perfumes, powders, or oil on skin. Artificial nails are not permitted.     Teach back successful and demonstrates knowledge of instruction.    You will received a call from the pre-op nurse by 5 pm on the business day prior to the scheduled procedure. If you have not spoken with a nurse, please check your voicemail. If you have not received an arrival time by 5 pm, please call [phone].

## 2021-08-02 NOTE — H&P (Signed)
Consult    Patient: Debbie Gonzalez MRN: [id number]  SSN: [id number]    Date of Birth: 1955/06/06  Age: 66 y.o.  Sex: female      Subjective:      Debbie Gonzalez is a 66 y.o. female who is about 5 weeks out from her most recent surgical intervention which was a revision fixation of her left Licon osteotomy and application of external fixator.  She is just doing okay she has a couple complaints.  She is really having a lot of discomfort with burning pain in her left upper extremity.  She says it seems to be very cold intolerant and she has a lot of the pain and what appears to be an ulnar nerve distribution.  She is also having some drainage and a prominent area over her left elbow.  It does appear that one of her K wires has sort of come through the skin slightly she has some serous drainage associated with this.  Says the pain medication really does not help with all of this much at all.  She is scheduled to get an EMG nerve conduction study soon.    Past Medical History:   Diagnosis Date    Anxiety     Closed fracture of capitellum of distal humerus, left, initial encounter 06/24/2021    Dr. Zoe Lan    Complication of neuromuscular block     patient states "chest tube placed after left elbow surgery because of punctured lung from block they gave me"    Hx of chest tube placement     chest tube removed 06/30/2021    Illness, unspecified     patient had scopalamine patch placed prior to ORIF of left elbow but does not have hx of N&V w/ anesthesia     Past Surgical History:   Procedure Laterality Date    CHEST SURGERY Left 06/30/2021    CHEST TUBE REMOVAL performed by Cecilio Asper, MD at [facility] ENDOSCOPY    COLONOSCOPY      CYST REMOVAL Left     top of left foot    ELBOW FRACTURE SURGERY Left 07/01/2021    ORIF/REVISION LEFT ELBOW FX/DIS - Possible External Fixator performed by Trudi Ida, MD at [facility] MAIN OR    FRACTURE SURGERY Left 06/24/2021    HUMERUS FRACTURE SURGERY Left 06/24/2021    ORIF LEFT CAPITELLUM FX  W/OLECRANON OSTEOTOMY performed by Trudi Ida, MD at [facility] MAIN OR    TUBAL LIGATION      WISDOM TOOTH EXTRACTION        FAMHX -No history of inflammatory arthritis   Social History     Tobacco Use    Smoking status: Never    Smokeless tobacco: Never   Substance Use Topics    Alcohol use: Yes     Alcohol/week: 14.0 standard drinks     Types: 14 Cans of beer per week      Current Outpatient Medications   Medication Sig Dispense Refill    amitriptyline (ELAVIL) 50 MG tablet Take 1 tablet by mouth nightly 10 tablet 0    ibuprofen (ADVIL;MOTRIN) 200 MG tablet Take 600 mg by mouth every 6 hours as needed for Pain      Multiple Vitamins-Minerals (PRESERVISION AREDS 2+MULTI VIT PO) Take 1 tablet by mouth in the morning and at bedtime      docusate sodium (COLACE) 100 MG capsule Take 100 mg by mouth as needed for Constipation  sertraline (ZOLOFT) 100 MG tablet Take 100 mg by mouth daily      Multiple Vitamin (MULTIVITAMIN ADULT PO) Take by mouth nightly      VITAMIN D PO Take by mouth nightly      ferrous sulfate (IRON 325) 325 (65 Fe) MG tablet Take 325 mg by mouth nightly      Calcium-Vitamin D-Vitamin K (VIACTIV PO) Take by mouth nightly       No current facility-administered medications for this visit.        No Known Allergies    Review of Systems:  A comprehensive review of systems was negative.    Objective:     There were no vitals filed for this visit.     Physical Exam:  Physical Exam:  General:  Alert, cooperative, no distress, appears stated age. Orientation she is alert and oriented person place time and situation   Eyes:  Conjunctivae/corneas clear. PERRL, EOMs intact. Fundi benign   Ears:  Normal TMs and external ear canals both ears.   Nose: Nares normal. Septum midline. Mucosa normal. No drainage or sinus tenderness.   Mouth/Throat: Lips, mucosa, and tongue normal. Teeth and gums normal.   Neck: Supple, symmetrical, trachea midline, no adenopathy, thyroid: no enlargment/tenderness/nodules, no carotid  bruit and no JVD.   Back:   Symmetric, no curvature. ROM normal. No CVA tenderness.   Lungs:   Clear to auscultation bilaterally.   Heart:  Regular rate and rhythm, S1, S2 normal, no murmur, click, rub or gallop.   Abdomen:   Soft, non-tender. Bowel sounds normal. No masses,  No organomegaly.       No lymphadenopathy in all 4 extremities  Alignment-she has near normal alignment of her left elbow  Range of motion-she has only about 20 or 30 degree arc of motion in the external fixator  Vascular-distal pulses palpable in left upper extremity  Sensory/motor-deep tendon reflexes normal left upper extremity.  Motor and sensory function intact in a radial median nerve distribution but her ulnar nerve still shows decreased sensation and I cannot confidently say that her first dorsal interosseous is working.  On previous examinations it appeared that there was some activity with her first dorsal interosseous for her motor exam but I did not elicit any today  Stability-no evidence of any instability  Tenderness to palpation throughout the left arm area  Skin-her pin sites are doing very well.  She does have an area that appears to be over her K wire near the olecranon osteotomy that does have a prominent K wire and I do believe this has actually punctured the skin.  She has some serous drainage from this  Gait-normal    Assessment:     @  Left distal humerus fracture with olecranon osteotomy now with painful hardware left elbow    Xrays and or studies:    Indications-follow-up left distal humerus fracture, findings-AP and lateral views of the left elbow shows the ulnohumeral and radiocapitellar joints appear to be well reduced.  The olecranon osteotomy appears to be healing and the reduction appears to be maintained.  She definitely has one of her K wires that is resting against the skin posteriorly.  The external fixator pins appear to be in reasonable position.  Overall alignment of the distal humerus fracture and  olecranon osteotomy is well aligned and it does appear to show early evidence of callus formation and healing impression-well aligned left elbow with prominent hardware left olecranon  Plan:  I have spoke with the patient regarding different treatment options.  I think that one of the first orders of business would be to take her back to the operating room and just with a very quick anesthetic remove her external fixator as well as the K wire that is causing her some discomfort.  I think I would go ahead and place her in a long-arm cast and we could follow her up in the cast to make sure her ulnohumeral joint appears to be well aligned.  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  I have tried some different medications but none of these seem to be helping a lot someone to review these and see if there is something else we might give her that might help with this pretty significant discomfort she is having with all of this neurogenic pain.  She seems to be comfortable with this plan so the primary portion of the plan is to proceed with external fixator removal and K wire removal as an outpatient tomorrow in the operating room    Signed By: Trudi Ida, MD     August 02, 2021

## 2021-08-02 NOTE — Addendum Note (Signed)
Addended by: Shanda Howells on: 08/02/2021 12:12 PM     Modules accepted: Orders

## 2021-08-02 NOTE — Progress Notes (Signed)
a         Consult    Patient: Debbie Gonzalez MRN: [id number]  SSN: [id number]    Date of Birth: Jul 13, 1955  Age: 66 y.o.  Sex: female      Subjective:      Debbie Gonzalez is a 66 y.o. female who is about 5 weeks out from her most recent surgical intervention which was a revision fixation of her left Licon osteotomy and application of external fixator.  She is just doing okay she has a couple complaints.  She is really having a lot of discomfort with burning pain in her left upper extremity.  She says it seems to be very cold intolerant and she has a lot of the pain and what appears to be an ulnar nerve distribution.  She is also having some drainage and a prominent area over her left elbow.  It does appear that one of her K wires has sort of come through the skin slightly she has some serous drainage associated with this.  Says the pain medication really does not help with all of this much at all.  She is scheduled to get an EMG nerve conduction study soon.    Past Medical History:   Diagnosis Date    Anxiety     Closed fracture of capitellum of distal humerus, left, initial encounter 06/24/2021    Dr. Zoe Lan    Complication of neuromuscular block     patient states "chest tube placed after left elbow surgery because of punctured lung from block they gave me"    Hx of chest tube placement     chest tube removed 06/30/2021    Illness, unspecified     patient had scopalamine patch placed prior to ORIF of left elbow but does not have hx of N&V w/ anesthesia     Past Surgical History:   Procedure Laterality Date    CHEST SURGERY Left 06/30/2021    CHEST TUBE REMOVAL performed by Cecilio Asper, MD at [facility] ENDOSCOPY    COLONOSCOPY      CYST REMOVAL Left     top of left foot    ELBOW FRACTURE SURGERY Left 07/01/2021    ORIF/REVISION LEFT ELBOW FX/DIS - Possible External Fixator performed by Trudi Ida, MD at [facility] MAIN OR    FRACTURE SURGERY Left 06/24/2021    HUMERUS FRACTURE SURGERY Left 06/24/2021    ORIF LEFT  CAPITELLUM FX W/OLECRANON OSTEOTOMY performed by Trudi Ida, MD at [facility] MAIN OR    TUBAL LIGATION      WISDOM TOOTH EXTRACTION        FAMHX -No history of inflammatory arthritis   Social History     Tobacco Use    Smoking status: Never    Smokeless tobacco: Never   Substance Use Topics    Alcohol use: Yes     Alcohol/week: 14.0 standard drinks     Types: 14 Cans of beer per week      Current Outpatient Medications   Medication Sig Dispense Refill    amitriptyline (ELAVIL) 50 MG tablet Take 1 tablet by mouth nightly 10 tablet 0    ibuprofen (ADVIL;MOTRIN) 200 MG tablet Take 600 mg by mouth every 6 hours as needed for Pain      Multiple Vitamins-Minerals (PRESERVISION AREDS 2+MULTI VIT PO) Take 1 tablet by mouth in the morning and at bedtime      docusate sodium (COLACE) 100 MG capsule Take 100 mg by mouth  as needed for Constipation      sertraline (ZOLOFT) 100 MG tablet Take 100 mg by mouth daily      Multiple Vitamin (MULTIVITAMIN ADULT PO) Take by mouth nightly      VITAMIN D PO Take by mouth nightly      ferrous sulfate (IRON 325) 325 (65 Fe) MG tablet Take 325 mg by mouth nightly      Calcium-Vitamin D-Vitamin K (VIACTIV PO) Take by mouth nightly       No current facility-administered medications for this visit.        No Known Allergies    Review of Systems:  A comprehensive review of systems was negative.    Objective:     There were no vitals filed for this visit.     Physical Exam:  Physical Exam:  General:  Alert, cooperative, no distress, appears stated age. Orientation she is alert and oriented person place time and situation   Eyes:  Conjunctivae/corneas clear. PERRL, EOMs intact. Fundi benign   Ears:  Normal TMs and external ear canals both ears.   Nose: Nares normal. Septum midline. Mucosa normal. No drainage or sinus tenderness.   Mouth/Throat: Lips, mucosa, and tongue normal. Teeth and gums normal.   Neck: Supple, symmetrical, trachea midline, no adenopathy, thyroid: no  enlargment/tenderness/nodules, no carotid bruit and no JVD.   Back:   Symmetric, no curvature. ROM normal. No CVA tenderness.   Lungs:   Clear to auscultation bilaterally.   Heart:  Regular rate and rhythm, S1, S2 normal, no murmur, click, rub or gallop.   Abdomen:   Soft, non-tender. Bowel sounds normal. No masses,  No organomegaly.       No lymphadenopathy in all 4 extremities  Alignment-she has near normal alignment of her left elbow  Range of motion-she has only about 20 or 30 degree arc of motion in the external fixator  Vascular-distal pulses palpable in left upper extremity  Sensory/motor-deep tendon reflexes normal left upper extremity.  Motor and sensory function intact in a radial median nerve distribution but her ulnar nerve still shows decreased sensation and I cannot confidently say that her first dorsal interosseous is working.  On previous examinations it appeared that there was some activity with her first dorsal interosseous for her motor exam but I did not elicit any today  Stability-no evidence of any instability  Tenderness to palpation throughout the left arm area  Skin-her pin sites are doing very well.  She does have an area that appears to be over her K wire near the olecranon osteotomy that does have a prominent K wire and I do believe this has actually punctured the skin.  She has some serous drainage from this  Gait-normal    Assessment:     @  Left distal humerus fracture with olecranon osteotomy now with painful hardware left elbow    Xrays and or studies:    Indications-follow-up left distal humerus fracture, findings-AP and lateral views of the left elbow shows the ulnohumeral and radiocapitellar joints appear to be well reduced.  The olecranon osteotomy appears to be healing and the reduction appears to be maintained.  She definitely has one of her K wires that is resting against the skin posteriorly.  The external fixator pins appear to be in reasonable position.  Overall  alignment of the distal humerus fracture and olecranon osteotomy is well aligned and it does appear to show early evidence of callus formation and healing impression-well aligned left elbow with  prominent hardware left olecranon  Plan:     I have spoke with the patient regarding different treatment options.  I think that one of the first orders of business would be to take her back to the operating room and just with a very quick anesthetic remove her external fixator as well as the K wire that is causing her some discomfort.  I think I would go ahead and place her in a long-arm cast and we could follow her up in the cast to make sure her ulnohumeral joint appears to be well aligned.  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  I have tried some different medications but none of these seem to be helping a lot someone to review these and see if there is something else we might give her that might help with this pretty significant discomfort she is having with all of this neurogenic pain.  She seems to be comfortable with this plan so the primary portion of the plan is to proceed with external fixator removal and K wire removal as an outpatient tomorrow in the operating room    Signed By: Trudi Ida, MD     August 02, 2021

## 2021-08-02 NOTE — H&P (View-Only) (Signed)
Consult    Patient: Debbie Gonzalez MRN: [id number]  SSN: [id number]    Date of Birth: 1955/06/06  Age: 66 y.o.  Sex: female      Subjective:      Debbie Gonzalez is a 66 y.o. female who is about 5 weeks out from her most recent surgical intervention which was a revision fixation of her left Licon osteotomy and application of external fixator.  She is just doing okay she has a couple complaints.  She is really having a lot of discomfort with burning pain in her left upper extremity.  She says it seems to be very cold intolerant and she has a lot of the pain and what appears to be an ulnar nerve distribution.  She is also having some drainage and a prominent area over her left elbow.  It does appear that one of her K wires has sort of come through the skin slightly she has some serous drainage associated with this.  Says the pain medication really does not help with all of this much at all.  She is scheduled to get an EMG nerve conduction study soon.    Past Medical History:   Diagnosis Date    Anxiety     Closed fracture of capitellum of distal humerus, left, initial encounter 06/24/2021    Dr. Zoe Lan    Complication of neuromuscular block     patient states "chest tube placed after left elbow surgery because of punctured lung from block they gave me"    Hx of chest tube placement     chest tube removed 06/30/2021    Illness, unspecified     patient had scopalamine patch placed prior to ORIF of left elbow but does not have hx of N&V w/ anesthesia     Past Surgical History:   Procedure Laterality Date    CHEST SURGERY Left 06/30/2021    CHEST TUBE REMOVAL performed by Cecilio Asper, MD at [facility] ENDOSCOPY    COLONOSCOPY      CYST REMOVAL Left     top of left foot    ELBOW FRACTURE SURGERY Left 07/01/2021    ORIF/REVISION LEFT ELBOW FX/DIS - Possible External Fixator performed by Trudi Ida, MD at [facility] MAIN OR    FRACTURE SURGERY Left 06/24/2021    HUMERUS FRACTURE SURGERY Left 06/24/2021    ORIF LEFT CAPITELLUM FX  W/OLECRANON OSTEOTOMY performed by Trudi Ida, MD at [facility] MAIN OR    TUBAL LIGATION      WISDOM TOOTH EXTRACTION        FAMHX -No history of inflammatory arthritis   Social History     Tobacco Use    Smoking status: Never    Smokeless tobacco: Never   Substance Use Topics    Alcohol use: Yes     Alcohol/week: 14.0 standard drinks     Types: 14 Cans of beer per week      Current Outpatient Medications   Medication Sig Dispense Refill    amitriptyline (ELAVIL) 50 MG tablet Take 1 tablet by mouth nightly 10 tablet 0    ibuprofen (ADVIL;MOTRIN) 200 MG tablet Take 600 mg by mouth every 6 hours as needed for Pain      Multiple Vitamins-Minerals (PRESERVISION AREDS 2+MULTI VIT PO) Take 1 tablet by mouth in the morning and at bedtime      docusate sodium (COLACE) 100 MG capsule Take 100 mg by mouth as needed for Constipation  sertraline (ZOLOFT) 100 MG tablet Take 100 mg by mouth daily      Multiple Vitamin (MULTIVITAMIN ADULT PO) Take by mouth nightly      VITAMIN D PO Take by mouth nightly      ferrous sulfate (IRON 325) 325 (65 Fe) MG tablet Take 325 mg by mouth nightly      Calcium-Vitamin D-Vitamin K (VIACTIV PO) Take by mouth nightly       No current facility-administered medications for this visit.        No Known Allergies    Review of Systems:  A comprehensive review of systems was negative.    Objective:     There were no vitals filed for this visit.     Physical Exam:  Physical Exam:  General:  Alert, cooperative, no distress, appears stated age. Orientation she is alert and oriented person place time and situation   Eyes:  Conjunctivae/corneas clear. PERRL, EOMs intact. Fundi benign   Ears:  Normal TMs and external ear canals both ears.   Nose: Nares normal. Septum midline. Mucosa normal. No drainage or sinus tenderness.   Mouth/Throat: Lips, mucosa, and tongue normal. Teeth and gums normal.   Neck: Supple, symmetrical, trachea midline, no adenopathy, thyroid: no enlargment/tenderness/nodules, no carotid  bruit and no JVD.   Back:   Symmetric, no curvature. ROM normal. No CVA tenderness.   Lungs:   Clear to auscultation bilaterally.   Heart:  Regular rate and rhythm, S1, S2 normal, no murmur, click, rub or gallop.   Abdomen:   Soft, non-tender. Bowel sounds normal. No masses,  No organomegaly.       No lymphadenopathy in all 4 extremities  Alignment-she has near normal alignment of her left elbow  Range of motion-she has only about 20 or 30 degree arc of motion in the external fixator  Vascular-distal pulses palpable in left upper extremity  Sensory/motor-deep tendon reflexes normal left upper extremity.  Motor and sensory function intact in a radial median nerve distribution but her ulnar nerve still shows decreased sensation and I cannot confidently say that her first dorsal interosseous is working.  On previous examinations it appeared that there was some activity with her first dorsal interosseous for her motor exam but I did not elicit any today  Stability-no evidence of any instability  Tenderness to palpation throughout the left arm area  Skin-her pin sites are doing very well.  She does have an area that appears to be over her K wire near the olecranon osteotomy that does have a prominent K wire and I do believe this has actually punctured the skin.  She has some serous drainage from this  Gait-normal    Assessment:     @HPROB@  Left distal humerus fracture with olecranon osteotomy now with painful hardware left elbow    Xrays and or studies:    Indications-follow-up left distal humerus fracture, findings-AP and lateral views of the left elbow shows the ulnohumeral and radiocapitellar joints appear to be well reduced.  The olecranon osteotomy appears to be healing and the reduction appears to be maintained.  She definitely has one of her K wires that is resting against the skin posteriorly.  The external fixator pins appear to be in reasonable position.  Overall alignment of the distal humerus fracture and  olecranon osteotomy is well aligned and it does appear to show early evidence of callus formation and healing impression-well aligned left elbow with prominent hardware left olecranon  Plan:       I have spoke with the patient regarding different treatment options.  I think that one of the first orders of business would be to take her back to the operating room and just with a very quick anesthetic remove her external fixator as well as the K wire that is causing her some discomfort.  I think I would go ahead and place her in a long-arm cast and we could follow her up in the cast to make sure her ulnohumeral joint appears to be well aligned.  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  I have tried some different medications but none of these seem to be helping a lot someone to review these and see if there is something else we might give her that might help with this pretty significant discomfort she is having with all of this neurogenic pain.  She seems to be comfortable with this plan so the primary portion of the plan is to proceed with external fixator removal and K wire removal as an outpatient tomorrow in the operating room    Signed By: Trudi Ida, MD     August 02, 2021

## 2021-08-03 ENCOUNTER — Ambulatory Visit: Admit: 2021-08-03 | Payer: MEDICARE | Primary: Internal Medicine

## 2021-08-03 ENCOUNTER — Inpatient Hospital Stay: Payer: MEDICARE

## 2021-08-03 MED ORDER — FENTANYL CITRATE (PF) 100 MCG/2ML IJ SOLN
100 MCG/2ML | Freq: Once | INTRAMUSCULAR | Status: DC | PRN
Start: 2021-08-03 — End: 2021-08-03

## 2021-08-03 MED ORDER — LACTATED RINGERS IV SOLN
INTRAVENOUS | Status: DC
Start: 2021-08-03 — End: 2021-08-03

## 2021-08-03 MED ORDER — HYDROMORPHONE HCL PF 2 MG/ML IJ SOLN
2 MG/ML | INTRAMUSCULAR | Status: DC | PRN
Start: 2021-08-03 — End: 2021-08-03

## 2021-08-03 MED ORDER — ONDANSETRON HCL 4 MG/2ML IJ SOLN
42 MG/2ML | INTRAMUSCULAR | Status: DC | PRN
Start: 2021-08-03 — End: 2021-08-03
  Administered 2021-08-03: 11:00:00 4 via INTRAVENOUS

## 2021-08-03 MED ORDER — LIDOCAINE HCL (PF) 2 % IJ SOLN
2 % | INTRAMUSCULAR | Status: DC | PRN
Start: 2021-08-03 — End: 2021-08-03
  Administered 2021-08-03: 11:00:00 100 via INTRAVENOUS

## 2021-08-03 MED ORDER — MIDAZOLAM HCL (PF) 2 MG/2ML IJ SOLN
2 MG/ML | Freq: Once | INTRAMUSCULAR | Status: DC | PRN
Start: 2021-08-03 — End: 2021-08-03

## 2021-08-03 MED ORDER — DEXAMETHASONE SODIUM PHOSPHATE 4 MG/ML IJ SOLN
4 MG/ML | INTRAMUSCULAR | Status: DC | PRN
Start: 2021-08-03 — End: 2021-08-03
  Administered 2021-08-03: 11:00:00 4 via INTRAVENOUS

## 2021-08-03 MED ORDER — ONDANSETRON HCL 4 MG/2ML IJ SOLN
4 | Freq: Once | INTRAMUSCULAR | Status: DC | PRN
Start: 2021-08-03 — End: 2021-08-03

## 2021-08-03 MED ORDER — GABAPENTIN 600 MG PO TABS
600 MG | ORAL_TABLET | Freq: Two times a day (BID) | ORAL | 1 refills | Status: AC
Start: 2021-08-03 — End: 2021-08-18

## 2021-08-03 MED ORDER — NORMAL SALINE FLUSH 0.9 % IV SOLN
0.9 % | Freq: Two times a day (BID) | INTRAVENOUS | Status: DC
Start: 2021-08-03 — End: 2021-08-03

## 2021-08-03 MED ORDER — OXYCODONE HCL 5 MG PO TABS
5 MG | ORAL | Status: DC | PRN
Start: 2021-08-03 — End: 2021-08-03

## 2021-08-03 MED ORDER — LACTATED RINGERS IV SOLN
INTRAVENOUS | Status: DC
Start: 2021-08-03 — End: 2021-08-03
  Administered 2021-08-03: 10:00:00 via INTRAVENOUS

## 2021-08-03 MED ORDER — SODIUM CHLORIDE 0.9 % IV SOLN
0.9 % | INTRAVENOUS | Status: DC | PRN
Start: 2021-08-03 — End: 2021-08-03

## 2021-08-03 MED ORDER — ACETAMINOPHEN 500 MG PO TABS
500 MG | Freq: Once | ORAL | Status: AC
Start: 2021-08-03 — End: 2021-08-03
  Administered 2021-08-03: 10:00:00 1000 mg via ORAL

## 2021-08-03 MED ORDER — KETOROLAC TROMETHAMINE 30 MG/ML IJ SOLN
30 MG/ML | INTRAMUSCULAR | Status: AC
Start: 2021-08-03 — End: ?

## 2021-08-03 MED ORDER — PROPOFOL 200 MG/20ML IV EMUL
200 MG/20ML | INTRAVENOUS | Status: DC | PRN
Start: 2021-08-03 — End: 2021-08-03
  Administered 2021-08-03: 11:00:00 200 via INTRAVENOUS

## 2021-08-03 MED ORDER — PROPOFOL 200 MG/20ML IV EMUL
200 MG/20ML | INTRAVENOUS | Status: AC
Start: 2021-08-03 — End: ?

## 2021-08-03 MED ORDER — DEXAMETHASONE SODIUM PHOSPHATE 4 MG/ML IJ SOLN
4 MG/ML | INTRAMUSCULAR | Status: AC
Start: 2021-08-03 — End: ?

## 2021-08-03 MED ORDER — CEFAZOLIN 2000 MG IN 20 ML SWFI IV SYRINGE (PREMIX)
Status: AC
Start: 2021-08-03 — End: 2021-08-03
  Administered 2021-08-03: 11:00:00 2000 mg via INTRAVENOUS

## 2021-08-03 MED ORDER — NORMAL SALINE FLUSH 0.9 % IV SOLN
0.9 % | INTRAVENOUS | Status: DC | PRN
Start: 2021-08-03 — End: 2021-08-03

## 2021-08-03 MED ORDER — ONDANSETRON HCL 4 MG/2ML IJ SOLN
42 MG/2ML | INTRAMUSCULAR | Status: AC
Start: 2021-08-03 — End: ?

## 2021-08-03 MED ORDER — GABAPENTIN 100 MG PO CAPS
100 MG | Freq: Once | ORAL | Status: AC
Start: 2021-08-03 — End: 2021-08-03
  Administered 2021-08-03: 11:00:00 200 mg via ORAL

## 2021-08-03 MED ORDER — KETOROLAC TROMETHAMINE 30 MG/ML IJ SOLN
30 MG/ML | INTRAMUSCULAR | Status: DC | PRN
Start: 2021-08-03 — End: 2021-08-03
  Administered 2021-08-03: 11:00:00 30 via INTRAVENOUS

## 2021-08-03 MED ORDER — LIDOCAINE HCL (PF) 2 % IJ SOLN
2 % | INTRAMUSCULAR | Status: AC
Start: 2021-08-03 — End: ?

## 2021-08-03 MED ORDER — DIPHENHYDRAMINE HCL 50 MG/ML IJ SOLN
50 MG/ML | Freq: Once | INTRAMUSCULAR | Status: DC | PRN
Start: 2021-08-03 — End: 2021-08-03

## 2021-08-03 MED FILL — ONDANSETRON HCL 4 MG/2ML IJ SOLN: 4 MG/2ML | INTRAMUSCULAR | Qty: 2

## 2021-08-03 MED FILL — TYLENOL EXTRA STRENGTH 500 MG PO TABS: 500 MG | ORAL | Qty: 2

## 2021-08-03 MED FILL — KETOROLAC TROMETHAMINE 30 MG/ML IJ SOLN: 30 MG/ML | INTRAMUSCULAR | Qty: 1

## 2021-08-03 MED FILL — DEXAMETHASONE SODIUM PHOSPHATE 4 MG/ML IJ SOLN: 4 MG/ML | INTRAMUSCULAR | Qty: 1

## 2021-08-03 MED FILL — CEFAZOLIN 2000 MG IN 20 ML SWFI IV SYRINGE (PREMIX): Qty: 2000

## 2021-08-03 MED FILL — XYLOCAINE-MPF 2 % IJ SOLN: 2 % | INTRAMUSCULAR | Qty: 5

## 2021-08-03 MED FILL — GABAPENTIN 100 MG PO CAPS: 100 MG | ORAL | Qty: 2

## 2021-08-03 MED FILL — DIPRIVAN 200 MG/20ML IV EMUL: 200 MG/20ML | INTRAVENOUS | Qty: 20

## 2021-08-03 NOTE — Discharge Instructions (Addendum)
No use of upper extremity, Sling at all times, Leave dressing intact until follow-up, and Resume regular diet   ACTIVITY  As tolerated and as directed by your doctor.   Bathe or shower as directed by your doctor.     DIET  Clear liquids until no nausea or vomiting; then light diet for the first day.  Advance to regular diet on second day, unless your doctor orders otherwise.   If nausea and vomiting continues, call your doctor.     PAIN  Take pain medication as directed by your doctor.   Call your doctor if pain is NOT relieved by medication.   DO NOT take aspirin of blood thinners unless directed by your doctor.     MEDICATION INTERACTION:During your procedure you potentially received a medication or medications which may reduce the effectiveness of oral contraceptives. Please consider other forms of contraception for 1 month following your procedure if you are currently using oral contraceptives as your primary form of birth control. In addition to this, we recommend continuing your oral contraceptive as prescribed, unless otherwise instructed by your physician, during this time      CALL YOUR DOCTOR IF   Excessive bleeding that does not stop after holding pressure over the area  Temperature of 101 degrees F or above  Excessive redness, swelling or bruising, and/ or green or yellow, smelly discharge from incision    After general anesthesia or intravenous sedation, for 24 hours or while taking prescription Narcotics:  Limit your activities  A responsible adult needs to be with you for the next 24 hours  Do not drive and operate hazardous machinery  Do not make important personal or business decisions  Do not drink alcoholic beverages  If you have not urinated within 8 hours after discharge, and you are experiencing discomfort from urinary retention, please go to the nearest ED.  If you have sleep apnea and have a CPAP machine, please use it for all naps and sleeping.  Please use caution when taking narcotics and  any of your home medications that may cause drowsiness.  *  Please give a list of your current medications to your Primary Care Provider.  *  Please update this list whenever your medications are discontinued, doses are      changed, or new medications (including over-the-counter products) are added.  *  Please carry medication information at all times in case of emergency situations.    These are general instructions for a healthy lifestyle:  No smoking/ No tobacco products/ Avoid exposure to second hand smoke  Surgeon General's Warning:  Quitting smoking now greatly reduces serious risk to your health.  Obesity, smoking, and sedentary lifestyle greatly increases your risk for illness  A healthy diet, regular physical exercise & weight monitoring are important for maintaining a healthy lifestyle    You may be retaining fluid if you have a history of heart failure or if you experience any of the following symptoms:  Weight gain of 3 pounds or more overnight or 5 pounds in a week, increased swelling in our hands or feet or shortness of breath while lying flat in bed.  Please call your doctor as soon as you notice any of these symptoms; do not wait until your next office visit.

## 2021-08-03 NOTE — Anesthesia Procedure Notes (Signed)
Airway  Date/Time: 08/03/2021 7:04 AM  Urgency: elective    Airway not difficult    General Information and Staff    Patient location during procedure: OR  Resident/CRNA: Vanetta Shawl, APRN - CRNA  Performed: resident/CRNA     Indications and Patient Condition  Indications for airway management: anesthesia  Spontaneous Ventilation: absent  Sedation level: deep  Preoxygenated: yes  Patient position: sniffing  MILS maintained throughout  Mask difficulty assessment: not attempted    Final Airway Details  Final airway type: supraglottic airway      Successful airway: oropharyngeal  Size 4     Number of attempts at approach: 1  Ventilation between attempts: bag mask  Number of other approaches attempted: 0    no

## 2021-08-03 NOTE — Interval H&P Note (Signed)
Update History & Physical      The Patient's History and Physical of 08/02/2021 was reviewed with the patient and I examined the patient.  There was no change.  The surgical site was confirmed by the patient and me.    Plan:  The risk, benefits, expected outcome, and alternative to the recommended procedure have been discussed with the patient.  Patient understands and wants to proceed with removal of external fixator left upper extremity, removal of hardware left elbow, application of long-arm cast left upper extremity  .  Electronically signed by Trudi Ida, MD on 08/03/2021 at 6:53 AM

## 2021-08-03 NOTE — Anesthesia Pre-Procedure Evaluation (Addendum)
Department of Anesthesiology  Preprocedure Note       Name:  Debbie Gonzalez   Age:  65 y.o.  DOB:  Apr 16, 1956                                          MRN:  [id number]         Date:  08/03/2021      Surgeon: Moishe Spice):  Trudi Ida, MD    Procedure: Procedure(s):  Left Elbow - Remove External Fixator & Remove Ulnar K-Wire    Medications prior to admission:   Prior to Admission medications    Medication Sig Start Date End Date Taking? Authorizing Provider   ibuprofen (ADVIL;MOTRIN) 200 MG tablet Take 600 mg by mouth every 6 hours as needed for Pain    Historical Provider, MD   Multiple Vitamins-Minerals (PRESERVISION AREDS 2+MULTI VIT PO) Take 1 tablet by mouth in the morning and at bedtime    Historical Provider, MD   docusate sodium (COLACE) 100 MG capsule Take 100 mg by mouth as needed for Constipation    Historical Provider, MD   sertraline (ZOLOFT) 100 MG tablet Take 100 mg by mouth every morning    Historical Provider, MD   Multiple Vitamin (MULTIVITAMIN ADULT PO) Take by mouth nightly    Historical Provider, MD   VITAMIN D PO Take by mouth nightly    Historical Provider, MD   ferrous sulfate (IRON 325) 325 (65 Fe) MG tablet Take 325 mg by mouth nightly    Historical Provider, MD   Calcium-Vitamin D-Vitamin K (VIACTIV PO) Take by mouth nightly    Historical Provider, MD       Current medications:    Current Facility-Administered Medications   Medication Dose Route Frequency Provider Last Rate Last Admin   . fentaNYL (SUBLIMAZE) injection 100 mcg  100 mcg IntraVENous Once PRN Thomes Lolling, MD       . lactated ringers IV soln infusion   IntraVENous Continuous Thomes Lolling, MD 125 mL/hr at 08/03/21 0543 New Bag at 08/03/21 0543   . sodium chloride flush 0.9 % injection 5-40 mL  5-40 mL IntraVENous 2 times per day Thomes Lolling, MD       . sodium chloride flush 0.9 % injection 5-40 mL  5-40 mL IntraVENous PRN Thomes Lolling, MD       . 0.9 % sodium chloride infusion   IntraVENous PRN Thomes Lolling, MD       . midazolam PF (VERSED) injection 2 mg  2 mg IntraVENous Once PRN Thomes Lolling, MD       . ceFAZolin (ANCEF) 2000 mg in sterile water 20 mL IV syringe  2,000 mg IntraVENous On Call to OR Trudi Ida, MD           Allergies:  No Known Allergies    Problem List:    Patient Active Problem List   Diagnosis Code   . Closed fracture of capitellum of distal humerus, left, initial encounter S42.452A   . Pneumothorax on left J93.9   . Pulmonary nodule R91.1   . Elbow fracture, left S42.402A   . Closed dislocation of left elbow S53.105A   . Displaced fracture of lateral condyle of left humerus, initial encounter for closed fracture S42.452A       Past Medical History:  Diagnosis Date   . Anxiety    . Closed fracture of capitellum of distal humerus, left, initial encounter 06/24/2021    Dr. Zoe Lan   . Complication of neuromuscular block     patient states "chest tube placed after left elbow surgery because of punctured lung from block they gave me"   . COVID-19 11/2019    denies hospitalization   . Hx of chest tube placement 06/30/2021    post-op pneumothorax---chest tube placed 06/24/21 removed 06/30/2021- saw [facility] 07/14/21   . Illness, unspecified     patient had scopalamine patch placed prior to ORIF of left elbow but does not have hx of N&V w/ anesthesia       Past Surgical History:        Procedure Laterality Date   . CHEST SURGERY Left 06/30/2021    CHEST TUBE REMOVAL performed by Cecilio Asper, MD at [facility] ENDOSCOPY   . COLONOSCOPY     . CYST REMOVAL Left     top of left foot   . ELBOW FRACTURE SURGERY Left 07/01/2021    ORIF/REVISION LEFT ELBOW FX/DIS - Possible External Fixator performed by Trudi Ida, MD at [facility] MAIN OR   . FRACTURE SURGERY Left 06/24/2021   . HUMERUS FRACTURE SURGERY Left 06/24/2021    ORIF LEFT CAPITELLUM FX W/OLECRANON OSTEOTOMY performed by Trudi Ida, MD at [facility] MAIN OR   . TUBAL LIGATION     . WISDOM TOOTH EXTRACTION         Social History:     Social History     Tobacco Use   . Smoking status: Never   . Smokeless tobacco: Never   Substance Use Topics   . Alcohol use: Yes     Alcohol/week: 14.0 standard drinks     Types: 14 Cans of beer per week                                Counseling given: Not Answered      Vital Signs (Current):   Vitals:    08/02/21 1134 08/03/21 0536   BP:  111/65   Pulse:  73   Resp:  18   Temp:  97.8 F (36.6 C)   TempSrc:  Oral   SpO2:  99%   Weight: 123 lb (55.8 kg) 124 lb (56.2 kg)   Height: 5' 8.5" (1.74 m) 5' 8.5" (1.74 m)                                              BP Readings from Last 3 Encounters:   08/03/21 111/65   07/14/21 97/60   07/04/21 107/65       NPO Status: Time of last liquid consumption: 0000                        Time of last solid consumption: 0000                        Date of last liquid consumption: 08/02/21                        Date of last solid food consumption: 08/02/21    BMI:   Wt Readings from  Last 3 Encounters:   08/03/21 124 lb (56.2 kg)   07/14/21 123 lb (55.8 kg)   07/01/21 128 lb (58.1 kg)     Body mass index is 18.58 kg/m.    CBC:   Lab Results   Component Value Date/Time    HGB 12.4 06/24/2021 08:39 AM       CMP: No results found for: NA, K, CL, CO2, BUN, CREATININE, GFRAA, AGRATIO, LABGLOM, GLUCOSE, GLU, PROT, CALCIUM, BILITOT, ALKPHOS, AST, ALT    POC Tests: No results for input(s): POCGLU, POCNA, POCK, POCCL, POCBUN, POCHEMO, POCHCT in the last 72 hours.    Coags: No results found for: PROTIME, INR, APTT    HCG (If Applicable): No results found for: PREGTESTUR, PREGSERUM, HCG, HCGQUANT     ABGs: No results found for: PHART, PO2ART, PCO2ART, HCO3ART, BEART, O2SATART     Type & Screen (If Applicable):  No results found for: LABABO, LABRH    Drug/Infectious Status (If Applicable):  No results found for: HIV, HEPCAB    COVID-19 Screening (If Applicable): No results found for: COVID19        Anesthesia Evaluation  Patient summary reviewed no history of anesthetic complications:    Airway: Mallampati: II  TM distance: >3 FB   Neck ROM: full  Mouth opening: > = 3 FB   Dental: normal exam         Pulmonary:Negative Pulmonary ROS breath sounds clear to auscultation                             Cardiovascular:  Exercise tolerance: good (>4 METS),           Rhythm: regular  Rate: normal                    Neuro/Psych:   Negative Neuro/Psych ROS              GI/Hepatic/Renal: Neg GI/Hepatic/Renal ROS            Endo/Other: Negative Endo/Other ROS                    Abdominal:             Vascular: negative vascular ROS.         Other Findings:           Anesthesia Plan      general     ASA 2     (LMA  gabapentin preop)  Induction: intravenous.      Anesthetic plan and risks discussed with patient.                        Thomes LollingADELE SUSAN Tajai Suder, MD   08/03/2021

## 2021-08-03 NOTE — Other (Signed)
Discharge instructions reviewed with pt and spouse who verbalize understanding of follow up care.

## 2021-08-03 NOTE — Anesthesia Post-Procedure Evaluation (Signed)
Department of Anesthesiology  Postprocedure Note    Patient: Debbie Gonzalez  MRN: [id number]  Birthdate: 1956-01-15  Date of evaluation: 08/03/2021      Procedure Summary     Date: 08/03/21 Room / Location: [facility] MAIN OR 08 / [facility] MAIN OR    Anesthesia Start: 0653 Anesthesia Stop: 0743    Procedure: Left Elbow - Remove External Fixator & Remove Ulnar K-Wire (Left) Diagnosis:       Dislocation of left elbow, initial encounter      Elbow fracture, left      (Dislocation of left elbow, initial encounter [S53.105A])      (Elbow fracture, left [S42.402A])    Providers: Trudi Ida, MD Responsible Provider: Thomes Lolling, MD    Anesthesia Type: general ASA Status: 2          Anesthesia Type: No value filed.    Aldrete Phase I: Aldrete Score: 9    Aldrete Phase II: Aldrete Score: 10      Anesthesia Post Evaluation    Patient location during evaluation: PACU  Patient participation: complete - patient participated  Level of consciousness: awake and alert  Airway patency: patent  Nausea & Vomiting: no nausea  Cardiovascular status: hemodynamically stable  Respiratory status: acceptable  Hydration status: euvolemic  Comments: Recommended patient follow up with pain management physician.

## 2021-08-03 NOTE — Op Note (Signed)
Operative Report    Patient: Debbie Gonzalez MRN: [id number]  SSN: [id number]    Date of Birth: 1955/12/10  Age: 66 y.o.  Sex: female       Date of Surgery: August 03, 2021     History:  Debbie Gonzalez is a 66 y.o. female who is really had quite an ordeal with her left upper extremity.  She originally had a distal humerus fracture that involve the capitellum and a significant portion of the articular surface of her distal humerus.  This was originally treated with open reduction internal fixation through an olecranon osteotomy.  Unfortunately intraoperatively there was iatrogenic fracture of the proximal fragment of her olecranon osteotomy which led to [place] abandoning the screw fixation and attempting plate fixation.  This failed took her back to the operating room performed K wire and tension band fixation of the olecranon osteotomy self and I then placed her in a hinged external fixator because she also had involvement of the attachment of the lateral ligaments in the distal humerus.  I had originally repaired this as well at the time of her original surgery but she still had some subluxation of her ulnohumeral joint so I placed her in a hinged external fixator.  She seemed to do pretty well with this.  Her ulnohumeral and radiocapitellar joint stayed reduced but now she has one of her K wires is backed out and is protruding through the skin and she really is at the point where we can take her external fixator off so she returns today for removal of the external fixator removal of this prominent K wire from her left elbow..      I talked to the patient and/or their representative and explained the exact nature the procedure.  I also went through a detailed list of the material risks associated with  the procedure which included risk of bleeding, infection, injury to nearby structures, worsening the situation, as well as the risks associate with anesthesia and finally death.  Also talked with him regarding  the benefits and alternatives to the procedure.    Preoperative Diagnosis: Dislocation of left elbow, initial encounter [S53.105A]  Elbow fracture, left [S42.402A]     Postoperative Diagnosis:   No displaced left distal humerus fracture, left elbow dislocation      Surgeon(s) and Role:     * Trudi Ida, MD - Primary    Anesthesia: Choice     Procedure: Removal of external fixator left elbow, removal of hardware left elbow, application of long-arm cast    Procedure in Detail: After the successful duction of general anesthetic the left upper extremity was examined we then removed first the components of the external fixator leaving just the Schanz pins in place.  These were then removed using a T-handle chuck.  We then dressed the pin sites and I used just a simple needle driver to remove the prominent K wire from the posterior aspect of her elbow.  Once I done this we dressed this wound with Acticoat as well and then placed the patient in a long-arm cast.  Once the cast was in place we brought in the C arm image intensifier to examine her elbow radiographically and make sure the ulnohumeral joint was well reduced.  Once I was assured of this she was awakened taken to cover him in stable condition      Estimated Blood Loss: 3 cc    Tourniquet Time: * No tourniquets in log *  Implants: * No implants in log *            Specimens: * No specimens in log *        Drains: None                Complications: None    Counts: Sponge and needle counts were correct times two.    Signed By:  Trudi Ida, MD     August 03, 2021

## 2021-08-04 ENCOUNTER — Encounter: Payer: MEDICARE | Attending: Orthopaedic Surgery | Primary: Internal Medicine

## 2021-08-04 DIAGNOSIS — S42402A Unspecified fracture of lower end of left humerus, initial encounter for closed fracture: Secondary | ICD-10-CM

## 2021-08-05 NOTE — Telephone Encounter (Signed)
Checking on status of referral to Dr Delora Fuel, has not heard from them yet.

## 2021-08-08 NOTE — Telephone Encounter (Signed)
He says someone was going to check with Dr. Donnie Aho about seeing her for her nerve issues. They have not heard from anyone and it has been a week. He is asking for the status of this please.

## 2021-08-08 NOTE — Telephone Encounter (Signed)
Debbie Gonzalez. Husband calling about referral to Dr Donnie Aho is wanting someone to call him and let him know status.

## 2021-08-08 NOTE — Telephone Encounter (Signed)
Call [facility] w/ [facility] Dr Donnie Aho [phone] says pat. Husband says our office has  been trying to get his wife in for an apt, Juliette Alcide called pat. And she was at the gym and couldn't talk Juliette Alcide wants to know which office does she need an apt at, says has not heard from our office yet?

## 2021-08-11 NOTE — Telephone Encounter (Signed)
Holland Falling from [facility] called need clearance please call [phone]

## 2021-08-11 NOTE — Telephone Encounter (Signed)
Per Dr Zoe Lan patient does not need antibiotics for dental procedures.    Called and spoke with nicole to make her aware.

## 2021-08-16 ENCOUNTER — Encounter: Payer: MEDICARE | Attending: Orthopaedic Surgery | Primary: Internal Medicine

## 2021-08-18 ENCOUNTER — Ambulatory Visit
Admit: 2021-08-18 | Discharge: 2021-08-18 | Payer: MEDICARE | Attending: Orthopaedic Surgery | Primary: Internal Medicine

## 2021-08-18 ENCOUNTER — Ambulatory Visit: Admit: 2021-08-18 | Discharge: 2021-08-18 | Payer: MEDICARE | Primary: Internal Medicine

## 2021-08-18 DIAGNOSIS — S42402A Unspecified fracture of lower end of left humerus, initial encounter for closed fracture: Secondary | ICD-10-CM

## 2021-08-18 DIAGNOSIS — S42452A Displaced fracture of lateral condyle of left humerus, initial encounter for closed fracture: Secondary | ICD-10-CM

## 2021-08-18 NOTE — Addendum Note (Signed)
Addended by: Leida Lauth on: 08/18/2021 01:18 PM     Modules accepted: Orders

## 2021-08-18 NOTE — Progress Notes (Signed)
Progress Note    Patient: Debbie Gonzalez MRN: [id number]  SSN: [id number]    Date of Birth: 19-Jun-1955  Age: 66 y.o.  Sex: female        08/18/2021      Subjective:     Patient is now about 2 weeks from her external fixator and K wire removal and about 8 weeks out from her original injury.  She had quite an ordeal with this whole process.  She did get a nerve block with one of the pain specialist and she says she has not really been able to tell much but she is scheduled for 2 more blocks from this.  She is just very happy to be out of her cast.  She still has some ulnar nerve symptoms    Objective:     There were no vitals filed for this visit.       Physical Exam:     Skin - incision is well healed with no redness or drainage  Motor and sensory function shows numbness in ulnar nerve distribution.  Pulses palpable in LEFT UPPER extremity     XRAY FINDINGS:  Indication-follow-up left distal humerus fracture, findings-AP and lateral views of the left elbow shows the left distal humerus fracture is still well aligned and appears to show significant evidence of healing.  Her olecranon osteotomy is sort of holding on and the ulnohumeral and radiocapitellar joints appear to be well reduced.  The proximal fragment of the olecranon still is not healed to the intact ulna but again shows no further displacement and the hardware appears to be intact.-Reasonly well aligned left distal humerus fracture and olecranon osteotomy    Assessment:     Left distal humerus fracture now about 8 weeks out    Plan:     I think at this point we are going to place her in a hinged elbow brace with full range of motion.  I want to get started with therapy.  I would like her to do full active and aggressive passive range of motion of the left elbow with flexion extension as well as pronation supination.  She can also be light strengthening exercises up to 10 pounds resistance.  In therapy.  She is asked about doing some very light  weights at the gym.  I think is reasonable just to do a very small like 2 pound dumbbell or so and work on some strengthening exercises on her own.  I will see her back in about 3 weeks with AP and lateral left elbow on return she will be about 11 weeks out from her original injury at that time    Signed By: Trudi Ida, MD     August 18, 2021

## 2021-08-24 ENCOUNTER — Inpatient Hospital Stay: Admit: 2021-08-24 | Payer: MEDICARE | Primary: Internal Medicine

## 2021-08-24 DIAGNOSIS — S42402A Unspecified fracture of lower end of left humerus, initial encounter for closed fracture: Secondary | ICD-10-CM

## 2021-08-24 NOTE — Other (Signed)
Debbie Gonzalez [facility]  DOB: Sep 22, 1955  Primary: [employer] Medicare Advantage (Medicare Managed)  Secondary:  [facility]  [address]  [place] [address]  Phone: [phone]  Fax: [phone] Plan Frequency: twice a week for 12 weeks    Plan of Care/Certification Expiration Date: 11/23/21      PT Visit Info:  Plan Frequency: twice a week for 12 weeks  Plan of Care/Certification Expiration Date: 11/23/21  Total # of Visits Approved: 1 (eval only on 08/24/2021)  Progress Note Counter: 1      Visit Count:  1                OUTPATIENT PHYSICAL THERAPY:             OP NOTE TYPE: Initial Assessment 08/24/2021               Episode (L elbow stiffness following surgery) Appt Desk         Treatment Diagnosis:  Pain in left forearm (I43.329)   Stiffness of left elbow, not elsewhere classified (M25.622)    Medical/Referring Diagnosis:  Closed fracture of left elbow, initial encounter [S42.402A]  Dislocation of left elbow, initial encounter [S53.105A]  Referring Physician:  Trudi Ida, MD  MD Orders:  PT Eval and Treat   Return MD Appt:  unspecified  Date of Onset:  Onset Date: 08/03/21 (date of most recent surgery)      Allergies:  Patient has no known allergies.  Restrictions/Precautions:    Restrictions/Precautions: Surgical Protocols        Medications Last Reviewed:  08/24/2021     SUBJECTIVE   History of Injury/Illness (Reason for Referral):  Location(s) of Injury: L elbow  Mechanism of Injury: Pt was walking quickly and fell on uneven sidewalk on 06/21/2021 - has had 3 surgeries since, 2nd surgery was a revision of first with external fixator applied, last surgery was just to take the external fixator out, followed by about 2 weeks of wearing cast (now she has a hinged elbow brace that she is supposed to be wearing as needed only)  Date of Surgery: Onset Date: 08/03/21 (date of most recent surgery)      Type of Surgery: 2nd surgery was a "revision fixation of her left  Licon osteotomy and application of external fixator  Aggravating Activities/Functional Limitations: has difficulty pulling up workout pants, fixing her hair, putting backs on earrings that she is wearing, washing her R UE with her L hand; pt has not driven yet (feels like she would not be secure)  Other Pertinent Information: States she has nerve damage from the multiple surgeries and had a nerve block to help with this (pt states she has numbness/tingling in 4th and 5th digits). States otherwise she does not have a ton of pain. Pt has a hinged elbow brace (with full ROM per MD note) but her hand started swelling when she was wearing it so she took it off and has not put it back on.   Restrictions/Precautions: Restrictions/Precautions: Surgical Protocols     Per MD note: "I would like her to do full active and aggressive passive range of motion of the left elbow with flexion extension as well as pronation supination.  She can also be light strengthening exercises up to 10 pounds resistance.  In therapy."   Patient Stated Goal(s):  "to be able to use my hand and arm again without a significant amount of discomfort"  Initial:  8 (8-9 (pain in hand))/10 Post Session:      (no specific number stated)/10  Past Medical History/Comorbidities:   Ms. Hermida  has a past medical history of Anxiety, Closed fracture of capitellum of distal humerus, left, initial encounter, Complication of neuromuscular block, COVID-19, Hx of chest tube placement, and Illness, unspecified.  Ms. Mergen  has a past surgical history that includes Colonoscopy; Wisdom tooth extraction; cyst removal (Left); Tubal ligation; fracture surgery (Left, 06/24/2021); Humerus fracture surgery (Left, 06/24/2021); Chest surgery (Left, 06/30/2021); Elbow fracture surgery (Left, 07/01/2021); and Arm Surgery (Left, 08/03/2021).  Social History/Living Environment:   Lives With: Spouse  Type of Home: Condo       Prior Level of Function/Work/Activity:   Prior level of  function: pt normally does treadmill, stair climber, elliptical, arc climber at [employer]  Current level of function: currently just using treadmill for cardio  Occupation: Retired  Type of Occupation: used to work for [employer]:   Does the patient/guardian have any barriers to learning?: No barriers  Will there be a co-learner?: No  What is the preferred language of the patient/guardian?: English  Is an interpreter required?: No  How does the patient/guardian prefer to learn new concepts?: Listening; Reading; Demonstration     Fall Risk Scale:   Morse Total Score: 0  Morse Fall Risk: Low (0-24)     Dominant Side:  right handed  Personal Factors:        Sex:  female        Age:  66 y.o.      OBJECTIVE   Initial Assessment on 08/24/2021   Observation/Orthostatic Postural Assessment:    The following postural deficits were noted in sitting: forward head, rounded shoulders, and elevated shoulder (right); pt wearing sling at L UE   The following postural deficits were noted in standing: no significant deficits in standing   Palpation:          L elbow with significant swelling and slight discoloration throughout L forearm; incision at L elbow healed but slightly puckered in  ROM:    Initial measurement: (on 08/24/2021) Initial measurement: (on 08/24/2021)   Mainly WFL throughout L wrist, but limited with following motions: shoulder flexion/abduction/ER (significantly limited and painful in active>passive) and wrist extension (limited)  L elbow AROM:     Elbow flexion: 105 degrees     Elbow extension: 56 degrees flexion     Elbow supination: WFL     Elbow pronation: WFL  L elbow PROM:     Elbow flexion: 126 degrees     Elbow extension: 47 degrees flexion R shoulder motion WFL  R elbow full range actively     Cervical AROM WFL    Strength:    Initial measurement: (on 08/24/2021) Initial measurement: (on 08/24/2021)     L UE: R UE:   Shoulder flexion  2+/5  4+/5    Shoulder abduction 2+/5  4+/5    Shoulder  extension 4+/5  5/5    Shoulder adduction 4+/5  5/5   Shoulder IR  4-/5  5/5    Shoulder ER 4-/5  4/5    Elbow flexion 5/5  5/5    Elbow extension 4-/5  5/5    Wrist flexion  4+/5  5/5    Wrist extension 4+/5  5/5       Special Tests:          -  Neurological Screen:  Myotomes:  decreased strength along ulnar nerve distribution at L        Dermatomes:  no deficits noted   ASSESSMENT   INITIAL ASSESSMENT:  Clayton LefortBeth Ellen Ost is a 66 y.o. female who presents to physical therapy for L elbow stiffness following multiple surgeries on her L elbow (details in history above), with most recent surgery on 08/03/2021. This session, pt demonstrated decreased L UE strength/endurance, decreased left shoulder, elbow, and wrist mobility with associated pain, multiple postural deficits, and decreased functional mobility as evident by a score of 50/55 on the Disabilities of the Arm, Shoulder, and Hand Questionnaire (with higher scores indicating increased disability). Pt may benefit from skilled PT to address the above listed deficits to improve ability to perform pain-free ADLs/IADLs and to improve overall quality of life prior to discharge.  Problem List: (Impacting functional limitations):    Body Structures, Functions, Activity Limitations Requiring Skilled Therapeutic Intervention: Decreased functional mobility ; Decreased ADL status; Decreased ROM; Decreased body mechanics; Decreased strength; Decreased sensation; Decreased high-level IADLs; Increased pain; Decreased posture       Therapy Prognosis:   Therapy Prognosis: Good       Initial Assessment Complexity:   Decision Making: Low Complexity      PLAN   Effective Dates: 08/24/2021 TO Plan of Care/Certification Expiration Date: 11/23/21     Frequency/Duration: Plan Frequency: twice a week for 12 weeks     Interventions Planned (Treatment may consist of any combination of the following):    Current Treatment Recommendations: Strengthening; ROM; Functional mobility training;  Therapeutic activities; Positioning; Modalities; Patient/Caregiver education & training; Home exercise program; Pain management; Manual lymphatic drainage; Manual; Neuromuscular re-education; Endurance training; ADL/Self-care training; Transfer training         Goals: (Goals have been discussed and agreed upon with patient.)  Short-Term Functional Goals: Time Frame: 6 weeks  Pt will be compliant with HEP in order to increase UE strength/endurance/mobility to improve functional mobility and overall quality of life.  Pt will improve score on the Disabilities of the Arm, Shoulder, and Hand (DASH) Questionnaire to 42/55 in order to improve independence with ADLs and IADLs and to improve overall quality of life.  Pt will increase left elbow extension PROM to 30 degrees flexion with minimal to no increase in pain in order to improve functional mobility and ability to reach forward.  Pt will increase left elbow flexion AROM to 115 degrees with minimal to no increase in pain in order to improve functional mobility and do her hair.  Discharge Goals: Time Frame: 12 weeks  Pt will be independent with HEP in order to increase UE strength/endurance/mobility to improve functional mobility and overall quality of life.  Pt will improve score on the Disabilities of the Arm, Shoulder, and Hand (DASH) Questionnaire to 35/55 in order to improve independence with ADLs and IADLs and to improve overall quality of life.  Pt will increase left elbow extension PROM to 10 degrees flexion with minimal to no increase in pain in order to improve functional mobility and ability to reach forward.  Pt will increase left elbow flexion AROM to 130 degrees with minimal to no increase in pain in order to improve functional mobility and do her hair.         OUTCOME MEASURE:   Disabilities of the Arm, Shoulder and Hand (DASH) Questionnaire - Quick Version:   Score:  Initial: 50/55  Most Recent: X/55 (Date: -- )   Interpretation of Score: The DASH is  designed to measure the activities of daily living in person's with upper extremity dysfunction or pain.  Each section is scored on a 1-5 scale, 5 representing the greatest disability.  The scores of each section are added together for a total score of 55.         Medical Necessity:   > Patient is expected to demonstrate progress in strength, range of motion, and functional technique to improve ability to perform pain-free ADLs/IADLs and to improve overall quality of life.  Reason For Services/Other Comments:  > Patient continues to require modification of therapeutic interventions to increase complexity of exercises.    Total Duration:  Time In: 1515  Time Out: 1605    Regarding Heydy Alvino Chapel Mellen's therapy, I certify that the treatment plan above will be carried out by a therapist or under their direction.  Thank you for this referral,  Jobe Gibbon, PT     Referring Physician Signature: Trudi Ida, MD                    Post Session Pain  Charge Capture  PT Visit Info MD Guidelines  MyChart

## 2021-08-24 NOTE — Progress Notes (Signed)
Debbie Gonzalez [facility]  DOB: [birth year]  Primary: [employer] Advantage (Medicare Managed)  Secondary:  [facility]  [address]  [place] [address]  Phone: [phone]  Fax: [phone] Plan Frequency: twice a week for 12 weeks    Plan of Care/Certification Expiration Date: 11/23/21      PT Visit Info:  Plan Frequency: twice a week for 12 weeks  Plan of Care/Certification Expiration Date: 11/23/21  Total # of Visits Approved: 1 (eval only on 08/24/2021)  Progress Note Counter: 1      Visit Count:  1    OUTPATIENT PHYSICAL THERAPY:OP NOTE TYPE: Treatment Note 08/24/2021       Episode  }Appt Desk             Treatment Diagnosis:  Pain in left forearm (M79.632)   Stiffness of left elbow, not elsewhere classified (M25.622)    Medical/Referring Diagnosis:  Closed fracture of left elbow, initial encounter [S42.402A]  Dislocation of left elbow, initial encounter [S53.105A]  Referring Physician:  Trudi Ida, MD  MD Orders:  PT Eval and Treat   Date of Onset:  Onset Date: 08/03/21 (date of most recent surgery)     Allergies:   Patient has no known allergies.  Restrictions/Precautions:  Restrictions/Precautions: Surgical Protocols  Other position/activity restrictions: -- (NO ROM LUE; KEEP IN SLING)     Interventions Planned (Treatment may consist of any combination of the following):    Current Treatment Recommendations: Strengthening; ROM; Functional mobility training; Therapeutic activities; Positioning; Modalities; Patient/Caregiver education & training; Home exercise program; Pain management; Manual lymphatic drainage; Manual; Neuromuscular re-education; Endurance training; ADL/Self-care training; Transfer training     Subjective Comments:  See history in chart.  Initial:}    8 (8-9 (pain in hand))/10Post Session:        (no specific number stated)/10  Medications Last Reviewed:  08/24/2021  Updated Objective Findings:  See evaluation note from today  Treatment    Assessment only today, no treatment provided.     Treatment/Session Summary:    Treatment Assessment:  See assessment in chart.  Communication/Consultation:  None today  Equipment provided today:  None  Recommendations/Intent for next treatment session: Next visit will focus on manual therapy/modalities as needed to reduce pain, improve edema, work on gentle elbow/wrist strengthening and mobility exercises.    Total Treatment Billable Duration:  0 minutes  Time In: 1515  Time Out: 1605    Jobe Gibbon, PT       Charge Capture  }Post Session Pain  PT Visit Info  [facility] Portal  MD Guidelines  Scanned Media  Benefits  MyChart    Future Appointments   Date Time Provider Department Center   09/08/2021  9:30 AM Trudi Ida, MD [facility]

## 2021-08-25 ENCOUNTER — Encounter: Payer: MEDICARE | Attending: Physical Medicine & Rehabilitation | Primary: Internal Medicine

## 2021-08-25 NOTE — Telephone Encounter (Signed)
other

## 2021-08-26 NOTE — Telephone Encounter (Signed)
Spoke with Dr Zoe Lan, he just took a pin out this am, there was clear fluid but no sign of infection, there was some inflammation but not to be concerned for infection    Relayed this info to patient made her aware that if drainage were to return or something comes up over the weekend she can call the on call provider for further instruction if needed      Voiced understanding.

## 2021-08-26 NOTE — Telephone Encounter (Signed)
Just left office from getting pin removed from elbow and thinks it's infected

## 2021-08-26 NOTE — Telephone Encounter (Signed)
Post op says 2nd pin is coming out of elbow from surg, is going out of town next week and needs to be seen ASAP

## 2021-08-26 NOTE — Telephone Encounter (Signed)
Spoke with patient. Dr. Zoe Lan called her and she is coming to office.

## 2021-08-30 LAB — CBC
Hematocrit: 36.2 % (ref 35.8–46.3)
Hemoglobin: 11.4 g/dL — ABNORMAL LOW (ref 11.7–15.4)
MCH: 30.6 PG (ref 26.1–32.9)
MCHC: 31.5 g/dL (ref 31.4–35.0)
MCV: 97.1 FL (ref 82–102)
MPV: 8.3 FL — ABNORMAL LOW (ref 9.4–12.3)
Platelets: 339 10*3/uL (ref 150–450)
RBC: 3.73 M/uL — ABNORMAL LOW (ref 4.05–5.2)
RDW: 14.4 % (ref 11.9–14.6)
WBC: 3.1 10*3/uL — ABNORMAL LOW (ref 4.3–11.1)
nRBC: 0 10*3/uL (ref 0.0–0.2)

## 2021-09-08 ENCOUNTER — Ambulatory Visit: Admit: 2021-09-08 | Discharge: 2021-09-08 | Payer: MEDICARE | Primary: Internal Medicine

## 2021-09-08 ENCOUNTER — Ambulatory Visit
Admit: 2021-09-08 | Discharge: 2021-09-08 | Payer: MEDICARE | Attending: Orthopaedic Surgery | Primary: Internal Medicine

## 2021-09-08 DIAGNOSIS — M25512 Pain in left shoulder: Secondary | ICD-10-CM

## 2021-09-08 DIAGNOSIS — S42402A Unspecified fracture of lower end of left humerus, initial encounter for closed fracture: Secondary | ICD-10-CM

## 2021-09-08 DIAGNOSIS — M25532 Pain in left wrist: Secondary | ICD-10-CM

## 2021-09-08 NOTE — Progress Notes (Signed)
Progress Note    Patient: Debbie Gonzalez MRN: [id number]  SSN: [id number]    Date of Birth: 04-25-1956  Age: 66 y.o.  Sex: female        09/08/2021      Subjective:     Patient is 5 weeks out from most recent surgical intervention with removal of the external fixator and I remove one of the K wires in her olecranon at that time.  Of note is the fact that did see her in the office so that unplanned visit in the last couple weeks before she went out of town on vacation and the second K wire was also prominent I made a small incision and remove this at the same time.  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  She says it is very hard selfies of actually help with not because she has some days where she is doing really well it seems that she is having very little discomfort and other days where she feels that she is making no progress all having a significant pain discomfort in her left upper extremity and she continues to have trouble sleeping at night quite often.  She is very frustrated with the whole thing.  She also having some left shoulder and left wrist pain.  He says is also very intermittent in nature and she feels the pain in her wrist is really more from the hand and sort of the neurogenic pain she is having from her ulnar nerve symptoms    Objective:     There were no vitals filed for this visit.       Physical Exam:     Skin - incision is well healed with no redness or drainage and I have removed just this 1 suture from the tiny incision made remove the last K wire  Motor and sensory function intact in LEFT UPPER extremity other than the fact that she has a significant mount of decree sensation in a pretty well-defined ulnar nerve distribution.  I have spent quite a time to see her today and it does appear that she has intact first dorsal interosseous function in her left hand it does appear that the ulnar nerve is functioning as far as her motor  Pulses palpable in LEFT  UPPER extremity     XRAY FINDINGS:  Indication-follow-up left distal humerus fracture, findings-AP views left elbow shows the distal humerus fracture is healed healed and reposition.  However the olecranon is still just very stable in its appearance with no evidence of any healing across olecranon osteotomy.  The figure-of-eight wire is still in place but now the K wires have both migrated and have been removed.  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  I had ordered a EMG nerve conduction study 6 weeks ago but I think this at cancel.  In any event I do think this is a pretty important piece of information and I think  she may need to have her ulnar nerve explored.  I have explained to the patient that it probably would be best to let the hand upper extremity person see her and render an opinion on whether or not they think her ulnar nerve needs to be explored.  Obviously with some sort of repair to be done then would likely need to be a hand surgeon that would do this anyway.  They also may want to have the EMG nerve flexion studies.  So after talking her about this her husband has identified someone at the hand center who actually was thinking of referring her to visit Dr. Gigi Gin.  This is an excellent choice I think to offer a second opinion on her elbow as well as the more specific talked her about her ulnar nerve palsy and whether not he think this needs to be explored.  She seems to be covered with this we will work on  getting her a referral to see Dr. Gigi Gin at the hand center here in [place] and then I will make her appointment see me in 4 weeks in follow-up.  As far as therapy is concerned that she can continue aggressive active and passive range of motion and therapy and we will wait on her to see Dr. Manson Passey and see what his recommendations are    Signed By: Trudi Ida, MD     September 08, 2021

## 2021-09-09 NOTE — Progress Notes (Signed)
2nd fax of notes to [facility] [phone]

## 2021-09-09 NOTE — Progress Notes (Signed)
Patient requested [facility] records to [facility] [id number].

## 2021-09-15 ENCOUNTER — Encounter: Payer: MEDICARE | Attending: Physical Medicine & Rehabilitation | Primary: Internal Medicine

## 2021-09-16 ENCOUNTER — Inpatient Hospital Stay: Admit: 2021-09-16 | Payer: MEDICARE | Primary: Internal Medicine

## 2021-09-16 DIAGNOSIS — S42402A Unspecified fracture of lower end of left humerus, initial encounter for closed fracture: Secondary | ICD-10-CM

## 2021-09-16 NOTE — Progress Notes (Signed)
Debbie Gonzalez [facility]  DOB: [birth year]  Primary: [employer] Medicare Advantage (Medicare Managed)  Secondary:  [facility]  [address]  [place] [address]  Phone: [phone]  Fax: [phone] Plan Frequency: twice a week for 12 weeks    Plan of Care/Certification Expiration Date: 11/23/21      PT Visit Info:  Plan Frequency: twice a week for 12 weeks  Plan of Care/Certification Expiration Date: 11/23/21  Total # of Visits Approved: 1 (eval only on 08/24/2021)  Progress Note Counter: 1      Visit Count:  2    OUTPATIENT PHYSICAL THERAPY:OP NOTE TYPE: Treatment Note 09/16/2021       Episode  }Appt Desk             Treatment Diagnosis:  Pain in left forearm (M79.632)   Stiffness of left elbow, not elsewhere classified (M25.622)    Medical/Referring Diagnosis:  Closed fracture of left elbow, initial encounter [S42.402A]  Dislocation of left elbow, initial encounter [S53.105A]  Referring Physician:  Trudi Ida, MD  MD Orders:  PT Eval and Treat   Date of Onset:  Onset Date: 08/03/21 (date of most recent surgery)     Allergies:   Patient has no known allergies.  Restrictions/Precautions:  Restrictions/Precautions: -- (lift only up to 10 lbs per MD)  Other position/activity restrictions: none currently     Interventions Planned (Treatment may consist of any combination of the following):    Current Treatment Recommendations: Strengthening; ROM; Functional mobility training; Therapeutic activities; Positioning; Modalities; Patient/Caregiver education & training; Home exercise program; Pain management; Manual lymphatic drainage; Manual; Neuromuscular re-education; Endurance training; ADL/Self-care training; Transfer training     Subjective Comments:  Pt stating she has been working on massage at home and is still not having much in the way of pain  Initial:}     (no pain reported)/10Post Session:        (no pain reported)/10  Medications Last Reviewed:  09/16/2021  Updated  Objective Findings:  None Today  Treatment   Therapeutic Exercise: ( 8 minutes):  Exercises per grid below to improve mobility, strength, and dynamic movement of left elbow to improve functional lifting, carrying, reaching, and overhead activites.  Required minimal visual, verbal, and tactile cues to promote proper body alignment, promote proper body posture, promote proper body mechanics, and promote proper body breathing techniques.  Progressed resistance, range, repetitions, and complexity of movement as indicated.                                   Most recent tx:   Date:  09/16/2021 Date:   Activity/Exercise Parameters    UBE L4 resistance for 6 minutes forward with cues to avoid excessive shoulder movement    Passive mobilization at L elbow into extension With 2 lb ankle weight at L wrist and cues of how to perform at home                             *given in HEP  MedBridge Portal   Pt given following band colors: []  Yellow          []  Red          []  Green          []  Blue          []   Grey      Manual therapy (32 minutes):  With pt in supported supine position:   - passive ROM at L elbow into flexion/extension  and supination/pronation with sustained holds to improve mobility   - retrograde edema massage to musculature and connective tissue surrounding L elbow and forearm to reduce edema and pain   - cross friction massage along L elbow incision to break up scar adhesions and improve mobility    Modalities (10 minutes): With pt in supported sitting position, vasopneumatic compression with cold therapy via Game Ready system applied to L elbow with medium pressure and at 34 degrees to reduce pain, swelling, and inflammation.    Treatment/Session Summary:    Treatment Assessment:  Pt still with some edema in her L elbow but this was reduced from first session and even less at end of session after manual therapy and vasopneumatic compression. Discussed how to continue to work on sustained elbow extension and  flexion passively at home with rice bag. Pt very motivated.  Communication/Consultation:  None today  Equipment provided today:  None  Recommendations/Intent for next treatment session: Next visit will focus on manual therapy/modalities as needed to reduce pain, improve edema, work on gentle elbow/wrist strengthening and mobility exercises.    Total Treatment Billable Duration:  50 minutes  Time In: 1302  Time Out: 1401    Jobe Gibbon, PT       Charge Capture  }Post Session Pain  PT Visit Info  MedBridge Portal  MD Guidelines  Scanned Media  Benefits  MyChart    Future Appointments   Date Time Provider Department Center   09/21/2021  1:45 PM Jobe Gibbon, PT [facility]   09/27/2021  1:45 PM Jobe Gibbon, PT [facility]   10/05/2021  1:45 PM Jobe Gibbon, PT [facility]   10/11/2021  9:45 AM Trudi Ida, MD [facility]   10/12/2021  1:45 PM Jobe Gibbon, PT [facility]

## 2021-09-21 ENCOUNTER — Inpatient Hospital Stay: Admit: 2021-09-21 | Payer: MEDICARE | Primary: Internal Medicine

## 2021-09-21 NOTE — Progress Notes (Signed)
Debbie Gonzalez [facility]  DOB: [birth year]  Primary: [employer]vantage (Medicare Managed)  Secondary:  [facility]  [address]  [place] [address]  Phone: [phone]  Fax: [phone] Plan Frequency: twice a week for 12 weeks    Plan of Care/Certification Expiration Date: 11/23/21      PT Visit Info:  Plan Frequency: twice a week for 12 weeks  Plan of Care/Certification Expiration Date: 11/23/21  Total # of Visits Approved: 6  Progress Note Counter: 3      Visit Count:  3    OUTPATIENT PHYSICAL THERAPY:OP NOTE TYPE: Treatment Note 09/21/2021       Episode  }Appt Desk             Treatment Diagnosis:  Pain in left forearm (T03.546)   Stiffness of left elbow, not elsewhere classified (M25.622)    Medical/Referring Diagnosis:  Closed fracture of left elbow, initial encounter [S42.402A]  Dislocation of left elbow, initial encounter [S53.105A]  Referring Physician:  Trudi Ida, MD  MD Orders:  PT Eval and Treat   Date of Onset:  Onset Date: 08/03/21 (date of most recent surgery)     Allergies:   Patient has no known allergies.  Restrictions/Precautions:  Restrictions/Precautions: -- (lift only up to 10 lbs per MD)  Other position/activity restrictions: none currently     Interventions Planned (Treatment may consist of any combination of the following):    Current Treatment Recommendations: Strengthening; ROM; Functional mobility training; Therapeutic activities; Positioning; Modalities; Patient/Caregiver education & training; Home exercise program; Pain management; Manual lymphatic drainage; Manual; Neuromuscular re-education; Endurance training; ADL/Self-care training; Transfer training     Subjective Comments:  Patient states she has been doing better overall and has been able to sleep in different positions with less overall pain.  Initial:}    1 (stating no real pain just discomfort)/10Post Session:        (no pain reported)/10  Medications Last Reviewed:   09/21/2021  Updated Objective Findings:  None Today  Treatment   Therapeutic Exercise: ( 8 minutes):  Exercises per grid below to improve mobility, strength, and dynamic movement of left elbow to improve functional lifting, carrying, reaching, and overhead activites.  Required minimal visual, verbal, and tactile cues to promote proper body alignment, promote proper body posture, promote proper body mechanics, and promote proper body breathing techniques.  Progressed resistance, range, repetitions, and complexity of movement as indicated.                                   Most recent tx:   Date:  09/21/2021 Date:  09/16/2021   Activity/Exercise  Parameters   UBE L4 resistance for 6 minutes forward with cues to avoid excessive shoulder movement  - attempted backward but pt reporting pain L4 resistance for 6 minutes forward with cues to avoid excessive shoulder movement   Passive mobilization at L elbow into extension Discussed potential for improvement with pt, also showed her how to mobilize scars With 2 lb ankle weight at L wrist and cues of how to perform at home                            *given in HEP  MedBridge Portal   Pt given following band colors: []  Yellow          []   Red          []  Green          []  Blue          []  Grey      Manual therapy (46 minutes):  With pt in supported supine position:   - passive ROM at L elbow into flexion/extension  and supination/pronation with sustained holds to improve mobility   - retrograde edema massage to musculature and connective tissue surrounding L elbow and forearm to reduce edema and pain   - cross friction massage along L elbow incisions to break up scar adhesions and improve mobility    Modalities (10 minutes): With pt in supported sitting position, vasopneumatic compression with cold therapy via Game Ready system applied to L elbow with medium pressure and at 34 degrees to reduce pain, swelling, and inflammation.    Treatment/Session Summary:    Treatment  Assessment:  Pt demonstrating less edema in L elbow with improved range into flexion and extension - encouraged her to continue to work on mobility at home.  Communication/Consultation:  None today  Equipment provided today:  None  Recommendations/Intent for next treatment session: Next visit will focus on manual therapy/modalities as needed to reduce pain, improve edema, work on gentle elbow/wrist strengthening and mobility exercises.    Total Treatment Billable Duration:  64 minutes  Time In: 1345  Time Out: 1452    , PT       Charge Capture  }Post Session Pain  PT Visit Info  MedBridge Portal  MD Guidelines  Scanned Media  Benefits  MyChart    Future Appointments   Date Time Provider Department Center   09/27/2021  1:45 PM , PT [facility]   10/05/2021  1:45 PM [facility], PT [facility]   10/11/2021  9:45 AM Jobe Gibbon, MD [facility]   10/12/2021  1:45 PM 10/13/2021, PT [facility]

## 2021-09-27 ENCOUNTER — Inpatient Hospital Stay: Admit: 2021-09-27 | Payer: MEDICARE | Primary: Internal Medicine

## 2021-09-27 NOTE — Progress Notes (Signed)
Debbie Gonzalez [facility]  DOB: Jul 19, 1955  Primary: [employer]vantage (Medicare Managed)  Secondary:  [facility]  [address]  [place] [address]  Phone: [phone]  Fax: [phone] Plan Frequency: twice a week for 12 weeks    Plan of Care/Certification Expiration Date: 11/23/21      PT Visit Info:  Plan Frequency: twice a week for 12 weeks  Plan of Care/Certification Expiration Date: 11/23/21  Total # of Visits Approved: 6  Progress Note Counter: 4      Visit Count:  4    OUTPATIENT PHYSICAL THERAPY:OP NOTE TYPE: Treatment Note 09/27/2021       Episode  }Appt Desk             Treatment Diagnosis:  Pain in left forearm (M79.632)   Stiffness of left elbow, not elsewhere classified (M25.622)    Medical/Referring Diagnosis:  Closed fracture of left elbow, initial encounter [S42.402A]  Dislocation of left elbow, initial encounter [S53.105A]  Referring Physician:  Trudi Ida, MD  MD Orders:  PT Eval and Treat   Date of Onset:  Onset Date: 08/03/21 (date of most recent surgery)     Allergies:   Patient has no known allergies.  Restrictions/Precautions:  Restrictions/Precautions: -- (lift only up to 10 lbs per MD)  Other position/activity restrictions: none currently     Interventions Planned (Treatment may consist of any combination of the following):    Current Treatment Recommendations: Strengthening; ROM; Functional mobility training; Therapeutic activities; Positioning; Modalities; Patient/Caregiver education & training; Home exercise program; Pain management; Manual lymphatic drainage; Manual; Neuromuscular re-education; Endurance training; ADL/Self-care training; Transfer training     Subjective Comments:  Patient states she feels like the swelling in her elbow has gone down.  Initial:}    0/10Post Session:       0/10  Medications Last Reviewed:  09/27/2021  Updated Objective Findings:  None Today  Treatment   Therapeutic Exercise: ( 8 minutes):  Exercises  per grid below to improve mobility, strength, and dynamic movement of left elbow to improve functional lifting, carrying, reaching, and overhead activites.  Required minimal visual, verbal, and tactile cues to promote proper body alignment, promote proper body posture, promote proper body mechanics, and promote proper body breathing techniques.  Progressed resistance, range, repetitions, and complexity of movement as indicated.                                   Most recent tx:   Date:  09/27/2021 Date:  09/21/2021 Date:  09/16/2021   Activity/Exercise   Parameters   UBE L4 resistance for 8 minutes forward with cues to avoid excessive shoulder movement  L4 resistance for 6 minutes forward with cues to avoid excessive shoulder movement  - attempted backward but pt reporting pain L4 resistance for 6 minutes forward with cues to avoid excessive shoulder movement   Passive mobilization at L elbow into extension  Discussed potential for improvement with pt, also showed her how to mobilize scars With 2 lb ankle weight at L wrist and cues of how to perform at home                                 *given in HEP  MedBridge Portal   Pt given following band colors: []  Yellow          []   Red          []  Green          []  Blue          []  Grey      Manual therapy (31 minutes):  With pt in supported supine position:   - passive ROM at L elbow into flexion/extension with sustained holds to improve mobility   - retrograde edema massage to musculature and connective tissue (some performed with silicone cupping) surrounding L elbow and forearm to reduce edema and pain   - cross friction massage along L elbow incisions to break up scar adhesions and improve mobility (some performed with silicone cupping)    Modalities (8 minutes): With pt in supported sitting position, vasopneumatic compression with cold therapy via Game Ready system applied to L elbow with medium progressing to high pressure and at 34 degrees to reduce pain, swelling, and  inflammation.    Treatment/Session Summary:    Treatment Assessment:  Pt continuing to demonstrate improved mobility overall in her L elbow with less swelling and no pain noted.  Communication/Consultation:  None today  Equipment provided today:  None  Recommendations/Intent for next treatment session: Next visit will focus on manual therapy/modalities as needed to reduce pain, improve edema, work on gentle elbow/wrist strengthening and mobility exercises.    Total Treatment Billable Duration:  47 minutes  Time In: 1350  Time Out: 1437    , PT       Charge Capture  }Post Session Pain  PT Visit Info  MedBridge Portal  MD Guidelines  Scanned Media  Benefits  MyChart    Future Appointments   Date Time Provider Department Center   10/05/2021  1:45 PM , PT [facility]   10/11/2021  9:45 AM [facility], MD [facility]   10/12/2021  1:45 PM 10/13/2021, PT [facility] 10/14/2021

## 2021-09-28 ENCOUNTER — Encounter: Payer: MEDICARE | Primary: Internal Medicine

## 2021-10-05 ENCOUNTER — Inpatient Hospital Stay: Admit: 2021-10-05 | Payer: MEDICARE | Primary: Internal Medicine

## 2021-10-05 NOTE — Progress Notes (Signed)
Debbie Gonzalez [facility]  DOB: 02/13/1956  Primary: [employer] Advantage (Medicare Managed)  Secondary:  [facility]  [address]  [place] [address]  Phone: [phone]  Fax: [phone] Plan Frequency: twice a week for 12 weeks    Plan of Care/Certification Expiration Date: 11/23/21      PT Visit Info:  Plan Frequency: twice a week for 12 weeks  Plan of Care/Certification Expiration Date: 11/23/21  Total # of Visits Approved: 6  Progress Note Counter: 5      Visit Count:  5    OUTPATIENT PHYSICAL THERAPY:OP NOTE TYPE: Treatment Note 10/05/2021       Episode  }Appt Desk             Treatment Diagnosis:  Pain in left forearm (M79.632)   Stiffness of left elbow, not elsewhere classified (M25.622)    Medical/Referring Diagnosis:  Closed fracture of left elbow, initial encounter [S42.402A]  Dislocation of left elbow, initial encounter [S53.105A]  Referring Physician:  Trudi Ida, MD  MD Orders:  PT Eval and Treat   Date of Onset:  Onset Date: 08/03/21 (date of most recent surgery)     Allergies:   Patient has no known allergies.  Restrictions/Precautions:  Restrictions/Precautions: -- (lift only up to 10 lbs per MD)  Other position/activity restrictions: none currently     Interventions Planned (Treatment may consist of any combination of the following):    Current Treatment Recommendations: Strengthening; ROM; Functional mobility training; Therapeutic activities; Positioning; Modalities; Patient/Caregiver education & training; Home exercise program; Pain management; Manual lymphatic drainage; Manual; Neuromuscular re-education; Endurance training; ADL/Self-care training; Transfer training     Subjective Comments:  Patient states she drove herself to therapy today. States she is now able to put her hair up and tie it, as well as lie on her L side in bed with less pain.  Initial:}    0/10Post Session:        (no pain reported)/10  Medications Last Reviewed:   10/05/2021  Updated Objective Findings:  None Today  Treatment   Therapeutic Exercise: ( 12 minutes):  Exercises per grid below to improve mobility, strength, and dynamic movement of left elbow to improve functional lifting, carrying, reaching, and overhead activites.  Required minimal visual, verbal, and tactile cues to promote proper body alignment, promote proper body posture, promote proper body mechanics, and promote proper body breathing techniques.  Progressed resistance, range, repetitions, and complexity of movement as indicated.                                   Most recent tx:   Date:  10/05/2021 Date:  09/27/2021 Date:  09/21/2021 Date:  09/16/2021   Activity/Exercise    Parameters   UBE L4 resistance for 5 minutes forward and 5 minutes backward with cues to avoid excessive shoulder movement  L4 resistance for 8 minutes forward with cues to avoid excessive shoulder movement  L4 resistance for 6 minutes forward with cues to avoid excessive shoulder movement  - attempted backward but pt reporting pain L4 resistance for 6 minutes forward with cues to avoid excessive shoulder movement   Passive mobilization at L elbow into extension   Discussed potential for improvement with pt, also showed her how to mobilize scars With 2 lb ankle weight at L wrist and cues of how to perform at home   Elbow  flexion/extension AROM Against wall to reduce shoulder compensation; 20 reps/1 set L UE; also performed several reps of pronation/supination                                  *given in HEP  MedBridge Portal   Pt given following band colors: []  Yellow          []  Red          []  Green          []  Blue          []  Grey      Manual therapy (41 minutes):  With pt in supported supine position:   - passive ROM at L elbow into flexion/extension as well as pronation/supination with sustained holds to improve mobility   - retrograde edema massage to musculature and connective tissue  surrounding L elbow and forearm to reduce edema and  pain   - cross friction massage along L elbow incisions to break up scar adhesions and improve mobility     Modalities () Pt declined at end of session.    Treatment/Session Summary:    Treatment Assessment:  Pt demonstrating significantly improved L elbow extension PROM this session (lacking 8 degrees flexion) as well as reduced overall edema and less puckering at scar.  Communication/Consultation:  None today  Equipment provided today:  None  Recommendations/Intent for next treatment session: Next visit will focus on manual therapy/modalities as needed to reduce pain, improve edema, work on gentle elbow/wrist strengthening and mobility exercises.    Total Treatment Billable Duration:  53 minutes  Time In: 1349  Time Out: 1445    , PT       Charge Capture  }Post Session Pain  PT Visit Info  MedBridge Portal  MD Guidelines  Scanned Media  Benefits  MyChart    Future Appointments   Date Time Provider Department Center   10/11/2021  9:45 AM , MD [facility]   10/12/2021  1:45 PM , PT [facility]

## 2021-10-11 ENCOUNTER — Ambulatory Visit
Admit: 2021-10-11 | Discharge: 2021-10-11 | Payer: MEDICARE | Attending: Orthopaedic Surgery | Primary: Internal Medicine

## 2021-10-11 ENCOUNTER — Ambulatory Visit: Admit: 2021-10-11 | Discharge: 2021-10-13 | Payer: MEDICARE | Primary: Internal Medicine

## 2021-10-11 ENCOUNTER — Encounter: Payer: MEDICARE | Attending: Orthopaedic Surgery | Primary: Internal Medicine

## 2021-10-11 DIAGNOSIS — S42402A Unspecified fracture of lower end of left humerus, initial encounter for closed fracture: Secondary | ICD-10-CM

## 2021-10-11 NOTE — Progress Notes (Signed)
Progress Note    Patient: Debbie Gonzalez MRN: [id number]  SSN: [id number]    Date of Birth: 18-Aug-1955  Age: 66 y.o.  Sex: female        10/11/2021      Subjective:     Patient is now about 3 and half months out from her original injury and about 8 weeks out her little over from her most recent surgical intervention which was just removal of hardware.  Today she is finally sort of having a little bit of improvement she seems pretty happy with where she is.  She says that as far as her elbow itself she does have some occasional pain in this but is pretty hit or miss and really the elbow pain is really pretty insignificant compared to her left hand pain.  She does think her left hand pain is gotten better or at least she is gotten more used to dealing with it.  At one of her last visits I had elected to send her to see one of the doctors at the [facility] group and they basically said that they would not see here for a second opinion.  I think it might be important for someone to see her to see if they thought was indicated to pursue a ulnar nerve exploration.  She is coming to the point where she really does not want to entertain any further surgical intervention even if it was offered to her so I do not think she is too disappointed that she did not get to see them.    Objective:     There were no vitals filed for this visit.       Physical Exam:     Skin - incision is well healed with no redness or drainage  Motor and sensory function intact in LEFT UPPER extremity exception of her numbness in ulnar nerve distribution.  She continues to show what I think are signs that she has motor function intact in the ulnar nerve distribution including her first dorsal interosseous.  Her grip strength is also improving  Pulses palpable in LEFT UPPER extremity     XRAY FINDINGS:  Indications-follow-up left elbow injury, findings-true AP and lateral views of the left elbow shows her distal humerus fracture has healed and  healed what appears to be reasonable position.  The hardware is intact.  As far as her olecranon osteotomy this is in very stable position she does have an established nonunion now I think of the olecranon osteotomy but this is essentially the exact same appearance as her previous x-rays.  She only has now a tension band wire holding this in place.  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  I think it is reasonable just to continue to watch this as functionally her range of motion is getting better her pain is getting better so she really is starting to improve.  I do not think  that there is really too much risk and just continue to wait this out and if she wants me to be more aggressive about treating this at some point in the future we can.  For now she is actually doing fairly well clinically.  #2-for her left distal humerus fracture this is actually healed so this is what of the least of her problems.  #3-for her ulnar nerve palsy it does seem like she is regaining some function with her grip strength but she still has a pretty dense numbness along an ulnar nerve distribution and still has a lot of pain in her ulnar nerve distribution.  It does seem like this is improving however.  #4-her complex regional pain syndrome she did receive some stellate ganglion blocks and she seems to be really feeling much better.  She does say she has a follow-up appointment to see the anesthesia pain doctor in the  next week or so but she is thinking about canceling that she is doing so well.    Have spent quite a bit of time talking about surgical interventions and I try to make sure that she understands that we want to do exactly what she is comfortable with this doing.  As far as her ulnar nerve is concerned I do think that the textbook answer would be to be a little more aggressive about exploring this but she really has been through so much not only with just her orthopedic issues but with having the pneumothorax after anesthesia so she really does not want to have any more surgical intervention and she thinks she is comfortable just waiting this out.  I do not think that is completely unreasonable.  I have also talked her little bit about the olecranon osteotomy nonunion and since she really is not very symptomatic with that right now I am not sure that it is not reasonable just continue to watch this.  We are also not burning any bridges as far as the fact that we can always be a little bit more aggressive with treatment of this at some point in the future if she wishes [place] to do so.  She seems to be okay with this plan.  I will continue working on her range of motion and pretty much would let her do anything she feels like doing now with her left upper extremity.  I will continue to watch this I will see her back in about 4 weeks with AP and lateral left elbow on return to see how she is doing.  She will be about 4-1/2 months out from her injury at that time.    Signed By: Trudi Ida, MD     Oct 11, 2021

## 2021-10-12 ENCOUNTER — Inpatient Hospital Stay: Admit: 2021-10-12 | Payer: MEDICARE | Primary: Internal Medicine

## 2021-10-12 NOTE — Discharge Instructions (Signed)
Debbie Gonzalez  DOB: 11-29-1955  Primary: [facility] [employer] Medicare Advantage (Medicare Managed)  [facility]:  [facility]  [address]  [place] [id number]  Phone: [phone]  Fax: [phone] Plan Frequency: twice a week for 12 weeks    Plan of Care/Certification Expiration Date: 11/23/21      PT Visit Info:  Plan Frequency: twice a week for 12 weeks  Plan of Care/Certification Expiration Date: 11/23/21  Total # of Visits Approved: 6  Progress Note Counter: 6      Visit Count:  6                OUTPATIENT PHYSICAL THERAPY:             OP NOTE TYPE: Discontinuation Summary 10/12/2021               Episode (L elbow stiffness following surgery) Appt Desk         Treatment Diagnosis:  Pain in left forearm (M57.846(M79.632)   Stiffness of left elbow, not elsewhere classified (M25.622)    Medical/Referring Diagnosis:  Closed fracture of left elbow, initial encounter [S42.402A]  Dislocation of left elbow, initial encounter [S53.105A]  Referring Physician:  Trudi IdaWatford, Kyle E, MD  MD Orders:  PT Eval and Treat   Return MD Appt:  unspecified  Date of Onset:  Onset Date: 08/03/21 (date of most recent surgery)      Allergies:  Patient has no known allergies.  Restrictions/Precautions:    Restrictions/Precautions: -- (lift only up to 10 lbs per MD)    Other position/activity restrictions: none currently     Medications Last Reviewed:  10/12/2021       ASSESSMENT   Debbie Gonzalez has been seen in physical therapy from 08/24/2021 to 10/12/2021 for 6 attended visits.   Treatment has been discontinued at this time due to Expiration of plan of care without further referral by ordering physician.  The below goals were not reassessed secondary to pt not attending additional sessions prior to discharge.  Thank you for this referral.   Problem List: (Impacting functional limitations):    Body Structures, Functions, Activity Limitations Requiring Skilled Therapeutic Intervention: Decreased  functional mobility ; Decreased ADL status; Decreased ROM; Decreased body mechanics; Decreased strength; Decreased sensation; Decreased high-level IADLs; Increased pain; Decreased posture       Therapy Prognosis:   Therapy Prognosis: Good       Initial Assessment Complexity:   Decision Making: Low Complexity      PLAN   Effective Dates: 08/24/2021 TO Plan of Care/Certification Expiration Date: 11/23/21     Frequency/Duration: Plan Frequency: twice a week for 12 weeks     Interventions Planned (Treatment may consist of any combination of the following):    Current Treatment Recommendations: Strengthening; ROM; Functional mobility training; Therapeutic activities; Positioning; Modalities; Patient/Caregiver education & training; Home exercise program; Pain management; Manual lymphatic drainage; Manual; Neuromuscular re-education; Endurance training; ADL/Self-care training; Transfer training         Goals: (Goals have been discussed and agreed upon with patient.)  Short-Term Functional Goals: Time Frame: 6 weeks  Pt will be compliant with HEP in order to increase UE strength/endurance/mobility to improve functional mobility and overall quality of life.  Pt will improve score on the Disabilities of the Arm, Shoulder, and Hand (DASH) Questionnaire to 42/55 in order to improve independence with ADLs and IADLs and to improve overall quality of life.  Pt will increase left elbow  extension PROM to 30 degrees flexion with minimal to no increase in pain in order to improve functional mobility and ability to reach forward.  Pt will increase left elbow flexion AROM to 115 degrees with minimal to no increase in pain in order to improve functional mobility and do her hair.  Discharge Goals: Time Frame: 12 weeks  Pt will be independent with HEP in order to increase UE strength/endurance/mobility to improve functional mobility and overall quality of life.  Pt will improve score on the Disabilities of the Arm, Shoulder, and Hand (DASH)  Questionnaire to 35/55 in order to improve independence with ADLs and IADLs and to improve overall quality of life.  Pt will increase left elbow extension PROM to 10 degrees flexion with minimal to no increase in pain in order to improve functional mobility and ability to reach forward.  Pt will increase left elbow flexion AROM to 130 degrees with minimal to no increase in pain in order to improve functional mobility and do her hair.         OUTCOME MEASURE:   Disabilities of the Arm, Shoulder and Hand (DASH) Questionnaire - Quick Version:   Score:  Initial: 50/55  Most Recent: X/55 (Date: -- )   Interpretation of Score: The DASH is designed to measure the activities of daily living in person's with upper extremity dysfunction or pain.  Each section is scored on a 1-5 scale, 5 representing the greatest disability.  The scores of each section are added together for a total score of 55.       Thank you for this referral,  Jobe Gibbon, PT     Referring Physician Signature: Trudi Ida, MD                    Post Session Pain  Charge Capture  PT Visit Info MD Guidelines  MyChart

## 2021-10-12 NOTE — Progress Notes (Signed)
Debbie Gonzalez [facility]  DOB: 1955/12/27  Primary: [facility] [employer] [employer]vantage (Medicare Managed)  Secondary:  [facility]  [address]  [place] [address]  Phone: [phone]  Fax: [phone] Plan Frequency: twice a week for 12 weeks    Plan of Care/Certification Expiration Date: 11/23/21      PT Visit Info:  Plan Frequency: twice a week for 12 weeks  Plan of Care/Certification Expiration Date: 11/23/21  Total # of Visits Approved: 6  Progress Note Counter: 6      Visit Count:  6    OUTPATIENT PHYSICAL THERAPY:OP NOTE TYPE: Treatment Note 10/12/2021       Episode  }Appt Desk             Treatment Diagnosis:  Pain in left forearm (M79.632)   Stiffness of left elbow, not elsewhere classified (M25.622)    Medical/Referring Diagnosis:  Closed fracture of left elbow, initial encounter [S42.402A]  Dislocation of left elbow, initial encounter [S53.105A]  Referring Physician:  Trudi Ida, MD  MD Orders:  PT Eval and Treat   Date of Onset:  Onset Date: 08/03/21 (date of most recent surgery)     Allergies:   Patient has no known allergies.  Restrictions/Precautions:  Restrictions/Precautions: -- (lift only up to 10 lbs per MD)  Other position/activity restrictions: none currently     Interventions Planned (Treatment may consist of any combination of the following):    Current Treatment Recommendations: Strengthening; ROM; Functional mobility training; Therapeutic activities; Positioning; Modalities; Patient/Caregiver education & training; Home exercise program; Pain management; Manual lymphatic drainage; Manual; Neuromuscular re-education; Endurance training; ADL/Self-care training; Transfer training     Subjective Comments:  Pt stating she still has no pain in her elbow - saw the MD recently.  Initial:}     (no pain verbalized)/10Post Session:        (no pain verbalized)/10  Medications Last Reviewed:  10/12/2021  Updated Objective Findings:  None Today  Treatment    Therapeutic Exercise: ( 25 minutes):  Exercises per grid below to improve mobility, strength, and dynamic movement of left elbow to improve functional lifting, carrying, reaching, and overhead activites.  Required minimal visual, verbal, and tactile cues to promote proper body alignment, promote proper body posture, promote proper body mechanics, and promote proper body breathing techniques.  Progressed resistance, range, repetitions, and complexity of movement as indicated.                                   Most recent tx:   Date:  10/12/2021 Date:  10/05/2021 Date:  09/27/2021 Date:  09/21/2021 Date:  09/16/2021   Activity/Exercise     Parameters   UBE L4 resistance for 8 minutes forward with cues to avoid excessive shoulder movement  L4 resistance for 5 minutes forward and 5 minutes backward with cues to avoid excessive shoulder movement  L4 resistance for 8 minutes forward with cues to avoid excessive shoulder movement  L4 resistance for 6 minutes forward with cues to avoid excessive shoulder movement  - attempted backward but pt reporting pain L4 resistance for 6 minutes forward with cues to avoid excessive shoulder movement   Passive mobilization at L elbow into extension    Discussed potential for improvement with pt, also showed her how to mobilize scars With 2 lb ankle weight at L wrist and cues of how to perform at home  Elbow flexion/extension AROM  Against wall to reduce shoulder compensation; 20 reps/1 set L UE; also performed several reps of pronation/supination      Bicep curls* 3 lb dumbbell; 20 reps/1 set L UE with cues to allow elbow to go through full extension mobility when lowering       Radial deviation* 2 lb dumbbell; 20 reps/1 set L UE with cues to maintain elbow flexion       Supination/pronation* 2 lb dumbbell; 20 reps/1 set L UE with cues to keep elbow flexed and avoid shoulder movement       Ulnar deviation* Red theraband resistance; 20 reps/1 set L UE with cues to avoid excessive forearm  movement       Triceps press downs* Red theraband resistance; 10 reps/1 set L UE                       *given in HEP  [employer] Portal  [id number]  Pt given following band colors: []  Yellow          []  Red          []  Green          []  Blue          []  Grey      Manual therapy (15 minutes):  With pt in supported supine position:   - passive ROM at L elbow into extension with sustained holds to improve mobility   - retrograde edema massage to musculature and connective tissue  surrounding L elbow and forearm to reduce edema and pain   - cross friction massage along L elbow incisions to break up scar adhesions and improve mobility     Treatment/Session Summary:    Treatment Assessment:  Pt with less edema surrounding L elbow today, incision looking well-healed and less puckered. Worked on strengthening exercises for elbow/forearm and gave pt HEP. Instructed pt that we will hold her spot until mid-June and if we have not heard from her by then we will go ahead and discharge. Pt verbalizing understanding.  Communication/Consultation:  None today  Equipment provided today:  None  Recommendations/Intent for next treatment session: Next visit will focus on manual therapy/modalities as needed to reduce pain, improve edema, work on gentle elbow/wrist strengthening and mobility exercises.    Total Treatment Billable Duration:  40 minutes  Time In: 1345  Time Out: 1428    , PT       Charge Capture  }Post Session Pain  PT Visit Info  [employer] Portal  MD Guidelines  Scanned Media  Benefits  MyChart    Future Appointments   Date Time Provider Department Center   11/22/2021  9:45 AM , MD [facility]

## 2021-11-22 ENCOUNTER — Ambulatory Visit
Admit: 2021-11-22 | Discharge: 2021-11-22 | Payer: MEDICARE | Attending: Orthopaedic Surgery | Primary: Internal Medicine

## 2021-11-22 ENCOUNTER — Ambulatory Visit: Admit: 2021-11-22 | Discharge: 2021-11-22 | Payer: MEDICARE | Primary: Internal Medicine

## 2021-11-22 DIAGNOSIS — S42402A Unspecified fracture of lower end of left humerus, initial encounter for closed fracture: Secondary | ICD-10-CM

## 2021-11-22 MED ORDER — GABAPENTIN 600 MG PO TABS
600 MG | ORAL_TABLET | Freq: Two times a day (BID) | ORAL | 1 refills | Status: AC
Start: 2021-11-22 — End: 2021-12-22

## 2021-11-22 NOTE — Progress Notes (Signed)
Progress Note    Patient: Debbie Gonzalez MRN: [id number]  SSN: [id number]    Date of Birth: 09-Jan-1956  Age: 66 y.o.  Sex: female        11/22/2021      Subjective:     Patient is now about 4-1/2 to 5 months out from an left elbow injury.  He really did have quite an ordeal with this.  She had multiple issues.  Her actual fracture which was a distal humerus fracture did well but she had an olecranon osteotomy that I had difficulty healing and had to go back and revise the osteotomy.  She is also had an ulnar nerve palsy.  And she has had just a lot of stiffness in her elbow.  Clinically now she is doing much better.  She is pretty satisfied even though she still has some pain she still has some definite numbness and lack of motion in ulnar nerve distribution and she has some limited range of motion.  However despite this she sort of learned to live with it and she seems like she would rather sort of deal with the known that she has versus any further intervention    Objective:     There were no vitals filed for this visit.       Physical Exam:     Skin - incision is well healed with no redness or drainage  Motor and sensory function intact in LEFT UPPER extremity with exception of the known ulnar nerve palsy.  She still has some numbness in her left small finger and she does not have any obvious intrinsic function in her left hand  Pulses palpable in LEFT UPPER extremity     XRAY FINDINGS:  Indication-follow-up left elbow injury, findings-true AP and lateral views of the left humerus shows the actual original fracture which was her distal humerus fracture appears to have healed healed in reasonable position.  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  I have talked to her in the past about the ulnar nerve palsy and we tried to make it happen for her to get referred to a hand surgeon but she just kind of got to the point where she seemed to be more comfortable dealing with what she has versus any further surgical intervention.  I have also talked her little bit today about removal of the cerclage wire.  But even that she just thinks she would rather stay the course.  She does not think it is really bothering her too much and I do not think that is completely unreasonable considering what she has been through.  As far as the left elbow contracture is concerned I talked her little bit about dynamic splinting and whether she would like to pursue that but once again she thinks she would just like to sort of deal with it and just allow her self is to continue to heal.  She does say the gabapentin seems to be helping some refilling that for her today.  I think otherwise she can follow-up on an  as-needed basis.  If she has any further symptoms I be happy to see her again but as long as she is doing well she does not need to return    Signed By: Trudi Ida, MD     November 22, 2021

## 2022-01-30 ENCOUNTER — Ambulatory Visit: Payer: MEDICARE | Primary: Internal Medicine

## 2022-01-30 DIAGNOSIS — R911 Solitary pulmonary nodule: Secondary | ICD-10-CM

## 2022-01-31 ENCOUNTER — Ambulatory Visit
Admit: 2022-01-31 | Discharge: 2022-01-31 | Payer: MEDICARE | Attending: Critical Care Medicine | Primary: Internal Medicine

## 2022-01-31 DIAGNOSIS — R911 Solitary pulmonary nodule: Secondary | ICD-10-CM

## 2022-01-31 NOTE — Progress Notes (Signed)
Name:  Debbie Gonzalez  Date of Birth:  1955/10/29   MRN: [id number]      Office Visit: 01/31/2022        ASSESSMENT AND PLAN:  (Medical Decision Making)    Impression:  66 y.o. female presents with post procedural PTX and instantly found to have a left lower lobe pulmonary nodule    1. Pulmonary nodule  Well over the past 6 months.  Given its size just over 6 mm I recommend a repeat in 6 months and then again presuming that stable in 1 year to complete 2 years of total monitoring to assure stability.  If there was no growth over a 2-year timeframe then this would be considered benign and would not need further imaging follow-up.  - CT CHEST WO CONTRAST; Future    2. Pneumothorax on left  I brought up the question of whether the lung nodule is related to the pneumothorax and we discussed that I do not believe that the lung nodule was specifically related to the iatrogenic pneumothorax.  Lung nodules are quite common and occur in 30 to 40% of patients who undergo CT scan as adults.  While the pneumothorax had no relationship to the lung nodule existing we only know about it because she underwent CT scan following the pneumothorax.    No orders of the defined types were placed in this encounter.    No orders of the defined types were placed in this encounter.    Follow-up and Dispositions    Return in about 6 months (around 08/01/2022) for Dr. Dayton Martes or NP Coutu, After CT.     Floreen Comber, MD    No specialty comments available.  _________________________________________________________________________    HISTORY OF PRESENT ILLNESS:    Debbie Gonzalez is a 66 y.o. female who is seen at [facility] today for  Pulmonary Nodule    66 y.o. female presents with post procedural PTX. She came in for elective L elbow surgery and had a L brachial block that was complicated by a large L PTX. Dr. Vivi Ferns placed a Uresil in left chest 2/10 evening with improvement and was able to be discharged. She has felt okay at home, still  sore with left arm. Some shortness of breath at times, but not worsening.  She returned on the 13th for possible UreSil removal, but there is still appear to be air leak by the diaphragm.  She returned again on the 16th and had her UreSil removed and is done well.  On that chest x-ray post removal she had a small left lower lobe pulmonary nodule and was set up for CT scan after this follow-up.  She denies any history of pulmonary issues.  She does still feel like it is a little bit hard to take a deep breath, but otherwise she is feeling well.    History: She presents today for follow-up.  She is had no issues since her last visit.  She is feeling well and had her CT scan yesterday and presents today for discussion.  She denies any fevers, chills, night sweats weight loss.    REVIEW OF SYSTEMS: 10 point review of systems is negative except as reported in HPI.    PHYSICAL EXAM: Body mass index is 18.46 kg/m.  Vitals:    01/31/22 1116   BP: 131/81   Resp: 14   SpO2: 98%   Weight: 125 lb (56.7 kg)   Height: 5\' 9"  (1.753 m)  General:   Alert, cooperative, no distress, appears stated age.        Eyes:   Conjunctivae/corneas clear. PERRL        Mouth/Throat:  Lips, mucosa, and tongue normal. Teeth and gums normal.        Lungs:   Clear breath sounds bilaterally, no wheeze or rails     Heart:   Regular rate and rhythm, S1, S2 normal, no murmur, click, rub or gallop.     Abdomen:    Soft, non-tender.     Extremities:  Extremities normal, atraumatic, no cyanosis or edema.     Skin:  Skin color normal. No rashes or lesions     Neurologic:  A&Ox3     DIAGNOSTIC TESTS:                                                                                    LABS:   Lab Results   Component Value Date/Time    WBC 3.1 08/30/2021 08:08 AM    HGB 11.4 08/30/2021 08:08 AM    HCT 36.2 08/30/2021 08:08 AM    PLT 339 08/30/2021 08:08 AM     Imaging: I performed an independent interpretation of the patient's images.  CXR:     XR ELBOW  LEFT (2 VIEWS) 11/22/2021    Narrative  AUTOMATIC ADMINISTRATIVE RESULT    The result for this exam can be found in the Progress note in the chart.    Impression  See Progress note in the chart.      CT Chest: 65mm LLL pulmonary nodule, otherwise normal lungs, stable    CT CHEST WO CONTRAST 01/30/2022    Narrative  History: Follow-up pulmonary nodule    EXAM: CT chest without contrast    TECHNIQUE: Thin section axial CT images are obtained from the thoracic inlet  through the upper abdomen. Radiation dose reduction techniques were used for  this study.  Our CT scanners use one or all of the following: Automated exposure  control, adjustment of the mA and/or kV according to patient size, use of  iterative reconstruction.    COMPARISON: 07/11/2021    FINDINGS: There is a stable left lower lobe pulmonary nodule measuring 6-7 mm  (image 199). No new pulmonary nodules demonstrated. No pleural or pericardial  effusion. No mediastinal, hilar, or axillary lymphadenopathy. Evaluation of the  upper abdomen demonstrates no definite abnormality.    Bone window evaluation demonstrates no aggressive osseous lesions.    Impression  Stable left lower lobe pulmonary nodule.    Nuclear Medicine: No results found for this or any previous visit from the past 3650 days.    PFTs:        No data to display              No results found for this or any previous visit. No results found for this or any previous visit.  FeNO: No results found for this or any previous visit.  FeNO and Likelihood of Eosinophilic Asthma   Unlikely Intermediate Likely   <25 ppb 25-50 ppb >50ppb   Exercise Oximetry:  Echo: No results found for this or any previous visit  from the past 3650 days.    PMH Reference Info:                                                                                                                Past Medical History:   Diagnosis Date    Anxiety     Closed fracture of capitellum of distal humerus, left, initial encounter 06/24/2021     Dr. Blima Ledger    Complication of neuromuscular block     patient states "chest tube placed after left elbow surgery because of punctured lung from block they gave me"    COVID-19 11/2019    denies hospitalization    Hx of chest tube placement 06/30/2021    post-op pneumothorax---chest tube placed 06/24/21 removed 06/30/2021- saw [facility] 07/14/21    Illness, unspecified     patient had scopalamine patch placed prior to ORIF of left elbow but does not have hx of N&V w/ anesthesia        Tobacco Use      Smoking status: Never      Smokeless tobacco: Never    No Known Allergies  Current Outpatient Medications   Medication Instructions    Calcium-Vitamin D-Vitamin K (VIACTIV PO) Oral, EVERY BEDTIME    docusate sodium (COLACE) 100 mg, Oral, AS NEEDED    ferrous sulfate (IRON 325) 325 mg, Oral, EVERY BEDTIME    gabapentin (NEURONTIN) 600 mg, Oral, 2 TIMES DAILY    ibuprofen (ADVIL;MOTRIN) 600 mg, Oral, EVERY 6 HOURS PRN    Multiple Vitamin (MULTIVITAMIN ADULT PO) Oral, EVERY BEDTIME    Multiple Vitamins-Minerals (PRESERVISION AREDS 2+MULTI VIT PO) 1 tablet, Oral, 2 times daily    sertraline (ZOLOFT) 100 mg, Oral, EVERY MORNING    VITAMIN D PO Oral, EVERY BEDTIME    vitamin E (VITAMIN E) 400 Units, Oral, DAILY

## 2022-05-04 NOTE — Telephone Encounter (Signed)
She says her gabapentin bottle says she has five refills but [facility] says the doctor has to approve it before they can fill it. Can you call [facility] on [facility] then let them know.

## 2022-09-26 ENCOUNTER — Inpatient Hospital Stay: Admit: 2022-09-26 | Payer: MEDICARE | Attending: Critical Care Medicine | Primary: Internal Medicine

## 2022-09-26 ENCOUNTER — Ambulatory Visit
Admit: 2022-09-26 | Discharge: 2022-09-26 | Payer: MEDICARE | Attending: Critical Care Medicine | Primary: Internal Medicine

## 2022-09-26 DIAGNOSIS — R911 Solitary pulmonary nodule: Secondary | ICD-10-CM

## 2022-09-26 NOTE — Progress Notes (Signed)
Name:  Debbie Gonzalez  Date of Birth:  Oct 10, 1955   MRN: [id number]      Office Visit: 09/26/2022        ASSESSMENT AND PLAN:  (Medical Decision Making)    Impression:  67 y.o. female presents with post procedural PTX and instantly found to have a left lower lobe pulmonary nodule    1. Pulmonary nodule  6.6 mm left lower lobe pulmonary nodule.  Appears stable on follow-up CT scan today.  Given size and Fleischner Society guidelines I recommend repeat in 1 year.  It appears to be stable over the past year and if stable for 2 complete years on her next scan she would likely not need any further follow-up.  Reassurance provided and I showed her her scan today along with her husband.  - CT CHEST WO CONTRAST; Future    2. Pneumothorax on left  No evidence of recurrence.  Was iatrogenic and not likely to recur.    No orders of the defined types were placed in this encounter.    No orders of the defined types were placed in this encounter.    Follow-up and Dispositions    Return in about 1 year (around 09/26/2023) for Dr. Dayton Martes or NP Coutu, After CT.     Floreen Comber, MD    No specialty comments available.  _________________________________________________________________________    HISTORY OF PRESENT ILLNESS:    Debbie Gonzalez is a 67 y.o. female who is seen at [facility] today for  Pulmonary Nodule and Pneumothorax on left    67 y.o. female presents with post procedural PTX. She came in for elective L elbow surgery and had a L brachial block that was complicated by a large L PTX. Dr. Vivi Ferns placed a Uresil in left chest 2/10 evening with improvement and was able to be discharged. She has felt okay at home, still sore with left arm. Some shortness of breath at times, but not worsening.  She returned on the 13th for possible UreSil removal, but there is still appear to be air leak by the diaphragm.  She returned again on the 16th and had her UreSil removed and is done well.  On that chest x-ray post removal she had  a small left lower lobe pulmonary nodule and was set up for CT scan after this follow-up.  She denies any history of pulmonary issues.  She does still feel like it is a little bit hard to take a deep breath, but otherwise she is feeling well.    History: She presents today for follow-up.  She is had no issues since her last visit.  She is feeling well and had her CT scan today and presents today for discussion.  She denies any fevers, chills, night sweats weight loss.    REVIEW OF SYSTEMS: 10 point review of systems is negative except as reported in HPI.    PHYSICAL EXAM: Body mass index is 19.16 kg/m.  Vitals:    09/26/22 1327   BP: 105/60   Pulse: 61   Resp: 20   Temp: 98 F (36.7 C)   TempSrc: Temporal   SpO2: 95%   Weight: 57.2 kg (126 lb)   Height: 1.727 m (5\' 8" )         General:   Alert, cooperative, no distress, appears stated age.        Eyes:   Conjunctivae/corneas clear. PERRL        Mouth/Throat:  Lips, mucosa, and tongue  normal. Teeth and gums normal.        Lungs:   Clear breath sounds bilaterally, no wheeze or rails     Heart:   Regular rate and rhythm, S1, S2 normal, no murmur, click, rub or gallop.     Abdomen:    Soft, non-tender.     Extremities:  Extremities normal, atraumatic, no cyanosis or edema.     Skin:  Skin color normal. No rashes or lesions     Neurologic:  A&Ox3     DIAGNOSTIC TESTS:                                                                                    LABS:   Lab Results   Component Value Date/Time    WBC 3.1 08/30/2021 08:08 AM    HGB 11.4 08/30/2021 08:08 AM    HCT 36.2 08/30/2021 08:08 AM    PLT 339 08/30/2021 08:08 AM     Imaging: I performed an independent interpretation of the patient's images.  CXR:     XR ELBOW LEFT (2 VIEWS) 11/22/2021    Narrative  AUTOMATIC ADMINISTRATIVE RESULT    The result for this exam can be found in the Progress note in the chart.    Impression  See Progress note in the chart.      CT Chest:  09/26/2022: stable LLL pulmonary nodule      6mm LLL pulmonary nodule, otherwise normal lungs, stable    CT CHEST WO CONTRAST 01/30/2022    Narrative  History: Follow-up pulmonary nodule    EXAM: CT chest without contrast    TECHNIQUE: Thin section axial CT images are obtained from the thoracic inlet  through the upper abdomen. Radiation dose reduction techniques were used for  this study.  Our CT scanners use one or all of the following: Automated exposure  control, adjustment of the mA and/or kV according to patient size, use of  iterative reconstruction.    COMPARISON: 07/11/2021    FINDINGS: There is a stable left lower lobe pulmonary nodule measuring 6-7 mm  (image 199). No new pulmonary nodules demonstrated. No pleural or pericardial  effusion. No mediastinal, hilar, or axillary lymphadenopathy. Evaluation of the  upper abdomen demonstrates no definite abnormality.    Bone window evaluation demonstrates no aggressive osseous lesions.    Impression  Stable left lower lobe pulmonary nodule.    Nuclear Medicine: No results found for this or any previous visit from the past 3650 days.    PFTs:        No data to display              No results found for this or any previous visit. No results found for this or any previous visit.  FeNO: No results found for this or any previous visit.  FeNO and Likelihood of Eosinophilic Asthma   Unlikely Intermediate Likely   <25 ppb 25-50 ppb >50ppb   Exercise Oximetry:  Echo: No results found for this or any previous visit from the past 3650 days.    PMH Reference Info:  Past Medical History:   Diagnosis Date    Anxiety     Closed fracture of capitellum of distal humerus, left, initial encounter 06/24/2021    Dr. Zoe Lan    Complication of neuromuscular block     patient states "chest tube placed after left elbow surgery because of punctured lung from block they gave me"    COVID-19 11/2019    denies hospitalization     Hx of chest tube placement 06/30/2021    post-op pneumothorax---chest tube placed 06/24/21 removed 06/30/2021- saw [facility] 07/14/21    Illness, unspecified     patient had scopalamine patch placed prior to ORIF of left elbow but does not have hx of N&V w/ anesthesia        Tobacco Use      Smoking status: Never      Smokeless tobacco: Never    No Known Allergies  Current Outpatient Medications   Medication Instructions    Calcium-Vitamin D-Vitamin K (VIACTIV PO) Oral, EVERY BEDTIME    docusate sodium (COLACE) 100 mg, Oral, AS NEEDED    doxycycline (ADOXA) 50 mg, Oral, DAILY    estrogens conjugated (PREMARIN) 0.5 g, Vaginal    famotidine (PEPCID) 20 mg, Oral, 2 TIMES DAILY    ferrous sulfate (IRON 325) 325 mg, Oral, EVERY BEDTIME    gabapentin (NEURONTIN) 600 mg, Oral, 2 TIMES DAILY    ibuprofen (ADVIL;MOTRIN) 600 mg, Oral, EVERY 6 HOURS PRN    Multiple Vitamin (MULTIVITAMIN ADULT PO) Oral, EVERY BEDTIME    Multiple Vitamins-Minerals (PRESERVISION AREDS 2+MULTI VIT PO) 1 tablet, Oral, 2 times daily    sertraline (ZOLOFT) 100 mg, Oral, EVERY MORNING    VITAMIN D PO Oral, EVERY BEDTIME    vitamin E (VITAMIN E) 400 Units, Oral, DAILY

## 2022-11-27 MED ORDER — GABAPENTIN 600 MG PO TABS
600 MG | ORAL_TABLET | Freq: Two times a day (BID) | ORAL | 1 refills | Status: DC
Start: 2022-11-27 — End: 2023-09-18

## 2022-11-27 NOTE — Telephone Encounter (Signed)
She needs to get a refill of her Gabapentin sent to the pharmacy on file.

## 2023-07-06 NOTE — Telephone Encounter (Signed)
 Called patient and left VM letting her know it was time to schedule her next appointment and to call the office when she is ready to schedule.     Return in about 1 year (around 09/26/2023) for Dr. Dayton Martes or NP Coutu, After CT.

## 2023-09-12 ENCOUNTER — Inpatient Hospital Stay: Admit: 2023-09-12 | Payer: MEDICARE | Attending: Critical Care Medicine | Primary: Internal Medicine

## 2023-09-12 DIAGNOSIS — R911 Solitary pulmonary nodule: Secondary | ICD-10-CM

## 2023-09-18 ENCOUNTER — Ambulatory Visit
Admit: 2023-09-18 | Discharge: 2023-09-18 | Payer: Medicare (Managed Care) | Attending: Critical Care Medicine | Primary: Internal Medicine

## 2023-09-18 VITALS — BP 110/60 | HR 61 | Temp 98.00000°F | Resp 20 | Ht 68.5 in | Wt 125.0 lb

## 2023-09-18 DIAGNOSIS — R911 Solitary pulmonary nodule: Secondary | ICD-10-CM

## 2023-09-18 NOTE — Progress Notes (Signed)
 Name:  Debbie Gonzalez  Date of Birth:  Jan 21, 1956   MRN: [id number]      Office Visit: 09/18/2023       Assessment & Plan (Medical Decision Making)    Impression: 68 y.o. female incidentally found 7mm LLL nodule during iatrogenic PTX evaluation      ICD-10-CM    1. Pulmonary nodule  R91.1       2. Pneumothorax on left  J93.9          Assessment & Plan  1. Left lower lobe pulmonary nodule: Stable.  - No further imaging needed.  - Considered benign.    2. Pneumothorax: Iatrogenic.  - Post-left brachial block for elbow surgery.  - No further follow-up or intervention needed.  - Low recurrence risk.    3. Left elbow numbness: Persistent.  - Post-surgery.  - Adapting to numbness, which is less painful than initially.     No orders of the defined types were placed in this encounter.    Follow-up and Dispositions    Return if symptoms worsen or fail to improve.       Wilmot Haskell, MD    No specialty comments available.  No problem-specific Assessment & Plan notes found for this encounter.      The patient (or guardian, if applicable) and other individuals in attendance with the patient were advised that Artificial Intelligence will be utilized during this visit to record, process the conversation to generate a clinical note, and support improvement of the AI technology. The patient (or guardian, if applicable) and other individuals in attendance at the appointment consented to the use of AI, including the recording.    _________________________________________________________________________    HISTORY OF PRESENT ILLNESS:    [name] is a 68 y.o. female who is seen at [facility] today for  Pulmonary Nodule     History of Present Illness  The patient, a 68 year old female, was initially evaluated in February 2020 for a spontaneous pneumothorax subsequent to a left brachial block administered for left elbow surgery. During this period, a left lower lobe pulmonary nodule was identified, which has demonstrated  stability over the past two years.    The patient reports relief regarding the stability of her pulmonary nodule.    Her elbow condition has shown improvement, with the previously experienced severe pain now replaced by paresthesia. Despite undergoing multiple brachial blocks by Dr. Kareen Osier, the paresthesia persists, and she is in the process of adapting to this altered sensation.    REVIEW OF SYSTEMS: 10 point review of systems is negative except as reported in HPI.    PHYSICAL EXAM: Body mass index is 18.73 kg/m.  Vitals:    09/18/23 1003   BP: 110/60   Pulse: 61   Resp: 20   Temp: 98 F (36.7 C)   TempSrc: Temporal   SpO2: 100%   Weight: 56.7 kg (125 lb)   Height: 1.74 m (5' 8.5")         General:   Alert, cooperative, no distress, appears stated age.        Eyes:   Conjunctivae/corneas clear. PERRL        Mouth/Throat:  Lips, mucosa, and tongue normal. Teeth and gums normal.        Lungs:   Clear breath sounds bilaterally, no wheeze or rails     Heart:   Regular rate and rhythm, S1, S2 normal, no murmur, click, rub or gallop.     Abdomen:  Soft, non-tender.     Extremities:  Extremities normal, atraumatic, no cyanosis or edema.     Skin:  Skin color normal. No rashes or lesions     Neurologic:  A&Ox3     DIAGNOSTIC TESTS:                                                                                    LABS:   Lab Results   Component Value Date/Time    WBC 3.1 08/30/2021 08:08 AM    HGB 11.4 08/30/2021 08:08 AM    HCT 36.2 08/30/2021 08:08 AM    PLT 339 08/30/2021 08:08 AM     Imaging: I performed an independent interpretation of the patient's images.  CXR:     XR ELBOW LEFT (2 VIEWS) 11/22/2021    Narrative  AUTOMATIC ADMINISTRATIVE RESULT    The result for this exam can be found in the Progress note in the chart.    Impression  See Progress note in the chart.      CT Chest:  4/31/2025    09/26/2022: stable LLL pulmonary nodule     6mm LLL pulmonary nodule, otherwise normal lungs, stable    CT CHEST WO  CONTRAST 09/12/2023    Narrative  CT CHEST    INDICATION: Follow-up pulmonary nodule    Multiple 2D axial images were obtained through the chest without IV contrast.  Radiation dose reduction techniques were used for this study:  All CT scans  performed at this facility use one or all of the following: Automated exposure  control, adjustment of the mA and/or kVp according to patient's size, iterative  reconstruction.    COMPARISON: 09/26/2022 and 07/11/2021    FINDINGS:  - LUNGS: There is a stable 7 mm nodule in the left lower lobe, image 102. There  are no new or enlarging pulmonary masses. No infiltrates or effusions.  - MEDIASTINUM/AXILLA: No significant adenopathy.    - HEART/VESSELS: Normal in appearance.  No significant Coronary artery  calcification.  - CHEST WALL/BONES: Normal.  - UPPER ABDOMEN: No acute findings.    Impression  Left lower lobe pulmonary nodule now demonstrates 2-year stability  consistent with a benign lesion. No acute findings in the chest.    Electronically signed by Ossie Blend    Nuclear Medicine: No results found for this or any previous visit from the past 3650 days.    PFTs:        No data to display              No results found for this or any previous visit. No results found for this or any previous visit.  FeNO: No results found for this or any previous visit.  FeNO and Likelihood of Eosinophilic Asthma   Unlikely Intermediate Likely   <25 ppb 25-50 ppb >50ppb   Exercise Oximetry:  Echo: No results found for this or any previous visit from the past 3650 days.    PMH Reference Info:  Past Medical History:   Diagnosis Date    Anxiety     Closed fracture of capitellum of distal humerus, left, initial encounter 06/24/2021    Dr. Arlin Laine    Complication of neuromuscular block     patient states "chest tube placed after left elbow surgery because of punctured lung from block  they gave me"    COVID-19 11/2019    denies hospitalization    Hx of chest tube placement 06/30/2021    post-op pneumothorax---chest tube placed 06/24/21 removed 06/30/2021- saw [facility] 07/14/21    Illness, unspecified     patient had scopalamine patch placed prior to ORIF of left elbow but does not have hx of N&V w/ anesthesia        Tobacco Use      Smoking status: Never      Smokeless tobacco: Never    No Known Allergies  Current Outpatient Medications   Medication Instructions    Calcium-Vitamin D-Vitamin K (VIACTIV PO) EVERY BEDTIME    estrogens conjugated (PREMARIN) 0.5 g    famotidine (PEPCID) 20 mg, 2 TIMES DAILY    ferrous sulfate (IRON 325) 325 mg, EVERY BEDTIME    Multiple Vitamin (MULTIVITAMIN ADULT PO) EVERY BEDTIME    Multiple Vitamins-Minerals (PRESERVISION AREDS 2+MULTI VIT PO) 1 tablet, 2 times daily    sertraline  (ZOLOFT ) 100 mg, EVERY MORNING    VITAMIN D PO EVERY BEDTIME    vitamin E (VITAMIN E) 400 Units, DAILY
